# Patient Record
Sex: Female | Born: 1975 | Race: Black or African American | Hispanic: No | Smoking: Never smoker
Health system: Southern US, Community
[De-identification: ages and names within clinical notes are randomized; demographics above are authoritative.]

## PROBLEM LIST (undated history)

## (undated) DIAGNOSIS — J45909 Unspecified asthma, uncomplicated: Secondary | ICD-10-CM

## (undated) DIAGNOSIS — L709 Acne, unspecified: Secondary | ICD-10-CM

## (undated) DIAGNOSIS — T07XXXA Unspecified multiple injuries, initial encounter: Secondary | ICD-10-CM

## (undated) DIAGNOSIS — H9190 Unspecified hearing loss, unspecified ear: Secondary | ICD-10-CM

## (undated) HISTORY — DX: Unspecified hearing loss, unspecified ear: H91.90

## (undated) HISTORY — PX: HAMMER TOE SURGERY: SHX385

## (undated) HISTORY — DX: Acne, unspecified: L70.9

## (undated) HISTORY — DX: Unspecified multiple injuries, initial encounter: T07.XXXA

## (undated) HISTORY — DX: Unspecified asthma, uncomplicated: J45.909

---

## 2000-06-04 ENCOUNTER — Other Ambulatory Visit: Admission: RE | Admit: 2000-06-04 | Discharge: 2000-06-04 | Payer: Self-pay | Admitting: Internal Medicine

## 2002-09-22 ENCOUNTER — Ambulatory Visit (HOSPITAL_COMMUNITY): Admission: AD | Admit: 2002-09-22 | Discharge: 2002-09-22 | Payer: Self-pay | Admitting: Obstetrics and Gynecology

## 2002-12-03 ENCOUNTER — Observation Stay (HOSPITAL_COMMUNITY): Admission: AD | Admit: 2002-12-03 | Discharge: 2002-12-04 | Payer: Self-pay | Admitting: Obstetrics and Gynecology

## 2002-12-13 ENCOUNTER — Ambulatory Visit (HOSPITAL_COMMUNITY): Admission: AD | Admit: 2002-12-13 | Discharge: 2002-12-14 | Payer: Self-pay | Admitting: Obstetrics and Gynecology

## 2002-12-21 ENCOUNTER — Observation Stay (HOSPITAL_COMMUNITY): Admission: AD | Admit: 2002-12-21 | Discharge: 2002-12-22 | Payer: Self-pay | Admitting: Obstetrics and Gynecology

## 2002-12-22 ENCOUNTER — Inpatient Hospital Stay (HOSPITAL_COMMUNITY): Admission: AD | Admit: 2002-12-22 | Discharge: 2002-12-25 | Payer: Self-pay | Admitting: Obstetrics and Gynecology

## 2006-06-05 ENCOUNTER — Ambulatory Visit: Payer: Self-pay | Admitting: Family Medicine

## 2006-06-05 DIAGNOSIS — J309 Allergic rhinitis, unspecified: Secondary | ICD-10-CM | POA: Insufficient documentation

## 2006-06-22 ENCOUNTER — Ambulatory Visit: Payer: Self-pay | Admitting: Family Medicine

## 2006-06-22 DIAGNOSIS — R1310 Dysphagia, unspecified: Secondary | ICD-10-CM | POA: Insufficient documentation

## 2006-06-23 ENCOUNTER — Encounter (INDEPENDENT_AMBULATORY_CARE_PROVIDER_SITE_OTHER): Payer: Self-pay | Admitting: Family Medicine

## 2006-06-23 ENCOUNTER — Telehealth (INDEPENDENT_AMBULATORY_CARE_PROVIDER_SITE_OTHER): Payer: Self-pay | Admitting: *Deleted

## 2006-06-25 ENCOUNTER — Ambulatory Visit (HOSPITAL_COMMUNITY): Admission: RE | Admit: 2006-06-25 | Discharge: 2006-06-25 | Payer: Self-pay | Admitting: Family Medicine

## 2006-07-01 ENCOUNTER — Encounter (INDEPENDENT_AMBULATORY_CARE_PROVIDER_SITE_OTHER): Payer: Self-pay | Admitting: Family Medicine

## 2006-07-14 ENCOUNTER — Ambulatory Visit (HOSPITAL_COMMUNITY): Admission: RE | Admit: 2006-07-14 | Discharge: 2006-07-14 | Payer: Self-pay | Admitting: Family Medicine

## 2006-07-20 ENCOUNTER — Ambulatory Visit: Payer: Self-pay | Admitting: Family Medicine

## 2006-07-20 DIAGNOSIS — E669 Obesity, unspecified: Secondary | ICD-10-CM | POA: Insufficient documentation

## 2006-07-20 DIAGNOSIS — E041 Nontoxic single thyroid nodule: Secondary | ICD-10-CM | POA: Insufficient documentation

## 2006-07-20 LAB — CONVERTED CEMR LAB
Cholesterol, target level: 200 mg/dL
LDL Goal: 160 mg/dL

## 2006-07-21 ENCOUNTER — Encounter (INDEPENDENT_AMBULATORY_CARE_PROVIDER_SITE_OTHER): Payer: Self-pay | Admitting: Family Medicine

## 2006-07-22 LAB — CONVERTED CEMR LAB
Candida species: NEGATIVE
GC Probe Amp, Genital: NEGATIVE

## 2007-05-24 ENCOUNTER — Encounter (INDEPENDENT_AMBULATORY_CARE_PROVIDER_SITE_OTHER): Payer: Self-pay | Admitting: Family Medicine

## 2007-09-03 ENCOUNTER — Ambulatory Visit: Payer: Self-pay | Admitting: Family Medicine

## 2007-10-01 ENCOUNTER — Other Ambulatory Visit: Admission: RE | Admit: 2007-10-01 | Discharge: 2007-10-01 | Payer: Self-pay | Admitting: Family Medicine

## 2007-10-01 ENCOUNTER — Encounter (INDEPENDENT_AMBULATORY_CARE_PROVIDER_SITE_OTHER): Payer: Self-pay | Admitting: Family Medicine

## 2007-10-01 ENCOUNTER — Ambulatory Visit: Payer: Self-pay | Admitting: Family Medicine

## 2007-10-04 LAB — CONVERTED CEMR LAB
ALT: 10 units/L (ref 0–35)
AST: 15 units/L (ref 0–37)
Basophils Absolute: 0 10*3/uL (ref 0.0–0.1)
Basophils Relative: 0 % (ref 0–1)
Cholesterol: 148 mg/dL (ref 0–200)
Creatinine, Ser: 0.72 mg/dL (ref 0.40–1.20)
Eosinophils Absolute: 0.1 10*3/uL (ref 0.0–0.7)
Eosinophils Relative: 2 % (ref 0–5)
HCT: 35.6 % — ABNORMAL LOW (ref 36.0–46.0)
Hemoglobin: 12 g/dL (ref 12.0–15.0)
MCHC: 33.7 g/dL (ref 30.0–36.0)
Monocytes Absolute: 0.2 10*3/uL (ref 0.1–1.0)
RDW: 13.9 % (ref 11.5–15.5)
TSH: 0.858 microintl units/mL (ref 0.350–4.50)
Total Bilirubin: 0.3 mg/dL (ref 0.3–1.2)
Total CHOL/HDL Ratio: 2.9
VLDL: 10 mg/dL (ref 0–40)

## 2008-06-16 ENCOUNTER — Ambulatory Visit: Payer: Self-pay | Admitting: Family Medicine

## 2008-06-16 LAB — CONVERTED CEMR LAB
Protein, U semiquant: NEGATIVE
Urobilinogen, UA: 1
pH: 7

## 2008-06-21 ENCOUNTER — Encounter (INDEPENDENT_AMBULATORY_CARE_PROVIDER_SITE_OTHER): Payer: Self-pay | Admitting: Family Medicine

## 2008-06-22 ENCOUNTER — Encounter (INDEPENDENT_AMBULATORY_CARE_PROVIDER_SITE_OTHER): Payer: Self-pay | Admitting: Family Medicine

## 2008-06-22 LAB — CONVERTED CEMR LAB: Hep B S Ab: POSITIVE — AB

## 2008-07-06 ENCOUNTER — Encounter (INDEPENDENT_AMBULATORY_CARE_PROVIDER_SITE_OTHER): Payer: Self-pay | Admitting: Family Medicine

## 2010-02-25 ENCOUNTER — Encounter: Payer: Self-pay | Admitting: Family Medicine

## 2010-06-21 NOTE — H&P (Signed)
NAME:  Sherri, Fleming                ACCOUNT NO.:  [id number]   MEDICAL RECORD NO.:  [id number]                   PATIENT TYPE:  INP   LOCATION:  LDR1                                 FACILITY:  [facility]   PHYSICIAN:  Tilda Burrow, M.D.              DATE OF BIRTH:  [birth year]   DATE OF ADMISSION:  12/22/2002  DATE OF DISCHARGE:                                HISTORY & PHYSICAL   CHIEF COMPLAINT:  Spontaneous rupture of membranes at 0945 without labor.   HISTORY OF PRESENT ILLNESS:  Sherri Fleming is a 35 year old gravida 1, para 0 with  an EDC of January 02, 2003, based on first and second trimester ultrasound  with a correlating last menstrual period placing her at 69 weeks'  gestational age.  She began prenatal care early in the first trimester and  has had regular visits.  Prenatal labs includes blood type A-positive,  rubella immune.  HBsAg, HIV, syphilis, gonorrhea and Chlamydia are all  negative.  One-hour GTT was normal at 109.  Sickle cell trait is negative.  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  Blood pressures have been anywhere from 110s to 150s  over 60s to 70s, with most of the blood pressures being in the 120-to-130  region.  Total weight gain has been 30 pounds with appropriate fundal height  growth.  The pregnancy course has basically been uneventful.   PAST MEDICAL HISTORY:  Past medical history positive for seasonal allergies.   PAST SURGICAL HISTORY:  Wisdom teeth removal.  No history of blood  transfusions or anesthesia complications.   ALLERGIES:  No known drug allergies.   SOCIAL HISTORY:  Married.  Works at [employer].   FAMILY HISTORY:  Family history noncontributory.   PHYSICAL EXAM:  HEENT:  Within normal limits.  HEART:  Regular rate and rhythm.  LUNGS:  Lungs are clear.  ABDOMEN:  Abdomen is soft and nontender.  She is having mild infrequent  uterine contractions at this point.  PELVIC:  Leaking clear amniotic fluid.  Cervix is 1 to 2, 50%,  minus 1,  which is not a change from an exam that she had 0830, five hours ago.  EXTREMITIES:  Legs are negative.   ACCESSORY CLINICAL DATA:  Fetal heart tracing is reactive without  decelerations.   IMPRESSION:  1. Intrauterine pregnancy at 38 weeks, premature rupture of membranes.  2. Group B streptococcus carrier.   PLAN:  GBS prophylaxis.  We will initiate Pitocin augmentation at this time.  The patient plans an epidural.     _____________________________________  ___________________________________________  Jacklyn Shell, C.N.M.        Tilda Burrow, M.D.   FC/MEDQ  D:  12/22/2002  T:  12/22/2002  Job:  [id number]   cc:   [facility]

## 2010-06-21 NOTE — Op Note (Signed)
NAME:  Sherri Fleming, Sherri Fleming                ACCOUNT NO.:  [id number]   MEDICAL RECORD NO.:  [id number]                   PATIENT TYPE:  INP   LOCATION:  A428                                 FACILITY:  [facility]   PHYSICIAN:  Tilda Burrow, M.D.              DATE OF BIRTH:  [birth year]   DATE OF PROCEDURE:  DATE OF DISCHARGE:                                 OPERATIVE REPORT   LABOR COURSE:  Sherri Fleming was noted to be fully dilated at approximately 2040.  She then developed an urge to push approximately an hour later.  After a 25-  minute second stage, she delivered a viable female infant at 2213.  The mouth  and nose were suctioned on the perineum and the body delivered without  difficulty.  Weight is 6 pounds 13 ounces, Apgars 9 and 9.  20 units of  Pitocin diluted in 1000 cc of lactated Ringer's was then infused rapidly IV.  The placenta separated spontaneously and was delivered via controlled cord  traction at 2217.  It was inspected and appeared to be intact with a three-  vessel cord.  The fundus was immediately firm, and no blood loss was noted.  The vagina was then inspected, and a shallow second-degree laceration was  noted.  It was infiltrated with 10 cc of 1% Xylocaine and repaired using a 2-  0 Vicryl.  The epidural catheter was then removed and the blue tip  visualized as being intact.  Estimated blood loss was less than 100 cc.     ________________________________________  ___________________________________________  Jacklyn Shell, C.N.M.           Tilda Burrow, M.D.   FC/MEDQ  D:  12/22/2002  T:  12/23/2002  Job:  [id number]

## 2010-06-21 NOTE — H&P (Signed)
NAME:  Sherri Fleming, Sherri Fleming                ACCOUNT NO.:  [id number]   MEDICAL RECORD NO.:  [id number]                   PATIENT TYPE:  INP   LOCATION:  LDR1                                 FACILITY:  [facility]   PHYSICIAN:  Tilda Burrow, M.D.              DATE OF BIRTH:  March 06, 1975   DATE OF ADMISSION:  12/03/2002  DATE OF DISCHARGE:                                HISTORY & PHYSICAL   ADMISSION DIAGNOSES:  1. Pregnancy at 35 weeks 5 days.  2. Preterm labor.   HISTORY OF PRESENT ILLNESS:  This is a 35 year old female, gravida 1, para  0, AB 0, LMP March 27, 2002, placing menstrual [facility] November 29 with  ultrasound suggested [facility] of November 22 at 7 weeks  and December 23, 2002,  at [redacted] weeks gestation.  She is admitted at this time after presenting with  light spotting noted per vagina.  External monitoring shows a uterine  irritability pattern with contractions of a mild nature every minute to  minute and one-half with relaxation of the uterus between.  The patient's  abdomen is essentially nontender.  She has had some slight periumbilical  tenderness since Tuesday.  Since her last office visit on Monday, she has  changed her cervix from closed, 50%, -2, and posterior to 1 cm, 75%, -1  station, equina anterior (same examiner).  The patient is admitted for  prodromal labor.  Even the gestational age is preterm by menstrual criteria,  the fetus is quite sizeable and estimated fetal weight clinical weight is  greater than 6 pounds.  Given the slightly atypical irritability pattern, we  will not attempt tocolysis but allow labor to progress.  Fetal well being  appears stable at present with good fetal activity and fetal heart rate  accelerations audible.  She has a negative CST.   PAST MEDICAL HISTORY:  Benign.   PAST SURGICAL HISTORY:  Wisdom tooth extraction.   ALLERGIES:  None.   HABITS:  Negative for cigarettes, alcohol, recreational drugs.   SOCIAL HISTORY:  Works for  our [employer].   PHYSICAL EXAMINATION:  GENERAL:  Healthy, large-frame, African-American  female who is quite comfortable.  VITAL SIGNS:  Height 5 feet 8 inches.  Estimated weight 200.  Blood pressure  145/80, temperature 98.8, pulse 83, respirations 18.   PLAN:  1. Admission.  2.     Allow to ambulate  3. Intermittent fetal monitoring.  4. Await patient's development of a more effective labor pattern.     ___________________________________________                                         Tilda Burrow, M.D.   JVF/MEDQ  D:  12/03/2002  T:  12/03/2002  Job:  [id number]   cc:   Francoise Schaumann. Halm, D.O.  [address]  [place]  [place] [address]  Fax: [phone]

## 2012-05-13 ENCOUNTER — Telehealth: Payer: Self-pay | Admitting: Family Medicine

## 2012-05-13 ENCOUNTER — Telehealth: Payer: Self-pay | Admitting: Nurse Practitioner

## 2012-05-13 MED ORDER — CETIRIZINE HCL 10 MG PO TABS: 10.0000 mg | ORAL_TABLET | Freq: Every day | ORAL | Status: DC

## 2012-05-13 NOTE — Telephone Encounter (Signed)
Tried to send in RX for Zyrtec but need name of pharmacy.

## 2012-05-13 NOTE — Telephone Encounter (Signed)
Pt wants prescription called in Zyrtec 10mg , taking two a day. She knows its over the counter but she needs prescription so she can pay with her flex spending

## 2012-05-13 NOTE — Telephone Encounter (Signed)
Patient requesting Rx for insurance reasons

## 2012-05-13 NOTE — Telephone Encounter (Signed)
[facility] in [place]- Rx electronically sent by [employer].

## 2012-05-13 NOTE — Telephone Encounter (Signed)
Patient notified

## 2012-05-20 ENCOUNTER — Encounter: Payer: Self-pay | Admitting: *Deleted

## 2012-05-24 ENCOUNTER — Encounter: Payer: Self-pay | Admitting: Nurse Practitioner

## 2012-05-24 ENCOUNTER — Ambulatory Visit (INDEPENDENT_AMBULATORY_CARE_PROVIDER_SITE_OTHER): Payer: BC Managed Care – PPO | Admitting: Nurse Practitioner

## 2012-05-24 VITALS — BP 110/72 | HR 80 | Ht 64.0 in | Wt 192.0 lb

## 2012-05-24 DIAGNOSIS — Z01419 Encounter for gynecological examination (general) (routine) without abnormal findings: Secondary | ICD-10-CM

## 2012-05-24 DIAGNOSIS — Z0000 Encounter for general adult medical examination without abnormal findings: Secondary | ICD-10-CM

## 2012-05-24 DIAGNOSIS — E669 Obesity, unspecified: Secondary | ICD-10-CM

## 2012-05-24 MED ORDER — ETONOGESTREL-ETHINYL ESTRADIOL 0.12-0.015 MG/24HR VA RING: VAGINAL_RING | VAGINAL | Status: DC

## 2012-05-24 MED ORDER — PHENTERMINE HCL 37.5 MG PO CAPS: 37.5000 mg | ORAL_CAPSULE | ORAL | Status: DC

## 2012-05-24 NOTE — Progress Notes (Signed)
  Subjective:    Patient ID: Sherri Fleming, female    DOB: 1975-08-04, 37 y.o.   MRN: [id number]  HPI  presents for wellness physical. Having regular menstrual cycles, normal flow. No breakthrough bleeding. Currently on NuvaRing. Same sexual partner. No vaginal discharge or pelvic pain. Has lost 35 pounds but has hit a plateau. Trying to exercise on a regular basis. Getting regular eye exams and dental exams. Had routine labs done this past fall at work, no copy available during office visit.    Review of Systems  Constitutional: Negative for activity change, appetite change and fatigue.  Respiratory: Negative for chest tightness and shortness of breath.   Cardiovascular: Negative for chest pain.  Gastrointestinal: Negative for abdominal distention.  Genitourinary: Negative for dysuria, urgency, frequency, vaginal discharge, difficulty urinating, menstrual problem and pelvic pain.       Objective:   Physical Exam  Constitutional: She is oriented to person, place, and time. She appears well-developed. No distress.  HENT:  Right Ear: External ear normal.  Left Ear: External ear normal.  Mouth/Throat: Oropharynx is clear and moist.  Neck: Normal range of motion. Neck supple. No tracheal deviation present. No thyromegaly present.  Cardiovascular: Normal rate, regular rhythm and normal heart sounds.  Exam reveals no gallop.   No murmur heard. Pulmonary/Chest: Effort normal and breath sounds normal.  Abdominal: Soft. She exhibits no distension. There is no tenderness.  Genitourinary: Vagina normal and uterus normal. No vaginal discharge found.  Musculoskeletal: She exhibits no edema.  Lymphadenopathy:    She has no cervical adenopathy.  Neurological: She is alert and oriented to person, place, and time.  Skin: Skin is warm and dry. No rash noted.  Psychiatric: She has a normal mood and affect. Her behavior is normal.  Breast exam dense tissue with some fine nodularity, no  dominant masses. Axilla no adenopathy. External GU normal limit. No CMT.        Assessment & Plan:  Well woman exam  Morbid obesity  Meds ordered this encounter  Medications  . polyethylene glycol powder (GLYCOLAX/MIRALAX) powder    Sig:   . phentermine 37.5 MG capsule    Sig: Take 1 capsule (37.5 mg total) by mouth every morning.    Dispense:  30 capsule    Refill:  2    Order Specific Question:  Supervising Provider    Answer:  Merlyn Albert [[id number]]  . etonogestrel-ethinyl estradiol (NUVARING) 0.12-0.015 MG/24HR vaginal ring    Sig: Insert vaginally and leave in place for 3 consecutive weeks, then remove for 1 week.    Dispense:  1 each    Refill:  12    Order Specific Question:  Supervising Provider    Answer:  Merlyn Albert [[id number]]   Discussed potential adverse effects of phentermine. DC med and call if any problems. Otherwise recheck in one month. Recommend healthy diet and regular exercise.

## 2012-05-24 NOTE — Assessment & Plan Note (Signed)
Started on phentermine. Recheck 1 month.

## 2012-08-10 ENCOUNTER — Ambulatory Visit (INDEPENDENT_AMBULATORY_CARE_PROVIDER_SITE_OTHER): Payer: BC Managed Care – PPO | Admitting: Family Medicine

## 2012-08-10 ENCOUNTER — Encounter: Payer: Self-pay | Admitting: Family Medicine

## 2012-08-10 VITALS — BP 110/80 | Temp 98.5°F | Wt 196.1 lb

## 2012-08-10 DIAGNOSIS — L301 Dyshidrosis [pompholyx]: Secondary | ICD-10-CM

## 2012-08-10 MED ORDER — TRIAMCINOLONE ACETONIDE 0.1 % EX CREA: TOPICAL_CREAM | Freq: Two times a day (BID) | CUTANEOUS | Status: DC

## 2012-08-10 NOTE — Progress Notes (Signed)
  Subjective:    Patient ID: Sherri Fleming, female    DOB: March 30, 1975, 37 y.o.   MRN: [id number]  Rash This is a new problem. The current episode started in the past 7 days. The problem has been gradually worsening since onset. The affected locations include the right hand. The rash is characterized by blistering and itchiness. She was exposed to nothing. Pertinent negatives include no anorexia, congestion or cough. Past treatments include topical steroids. The treatment provided mild relief.      Review of Systems  HENT: Negative for congestion.   Respiratory: Negative for cough.   Gastrointestinal: Negative for anorexia.  Skin: Positive for rash.       Objective:   Physical Exam  Acute distress. Lungs clear. Heart regular in rhythm. HEENT normal. Hands patch of blisters pruritic in nature.      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 Symptomatic care discussed. WSL

## 2012-08-10 NOTE — Patient Instructions (Signed)
dyshydrotic eczema

## 2012-11-03 ENCOUNTER — Other Ambulatory Visit: Payer: Self-pay | Admitting: Nurse Practitioner

## 2012-11-03 ENCOUNTER — Telehealth: Payer: Self-pay | Admitting: Nurse Practitioner

## 2012-11-03 ENCOUNTER — Other Ambulatory Visit: Payer: Self-pay | Admitting: Family Medicine

## 2012-11-03 MED ORDER — ALBUTEROL SULFATE HFA 108 (90 BASE) MCG/ACT IN AERS: 2.0000 | INHALATION_SPRAY | RESPIRATORY_TRACT | Status: DC | PRN

## 2012-11-03 NOTE — Telephone Encounter (Signed)
Patient notified

## 2012-11-03 NOTE — Telephone Encounter (Signed)
Pt states she had an Albuterol Inhaler in the past and wants to know if we can call her in a script for a new one to [facility], this was prescribed through her previous doc see chart?

## 2012-11-03 NOTE — Telephone Encounter (Signed)
Yes. Will refill based on history. Call back if needed for wheezing more than 3 times per week.

## 2012-12-09 ENCOUNTER — Other Ambulatory Visit: Payer: Self-pay

## 2012-12-16 ENCOUNTER — Other Ambulatory Visit: Payer: Self-pay | Admitting: Nurse Practitioner

## 2012-12-16 ENCOUNTER — Other Ambulatory Visit: Payer: Self-pay | Admitting: Family Medicine

## 2012-12-27 ENCOUNTER — Telehealth: Payer: Self-pay | Admitting: Family Medicine

## 2012-12-27 NOTE — Telephone Encounter (Signed)
Please refill phentermine 37.5 MG capsule   The following meds need to be resent to [facility] [place] They tell the patient they never got them on 12/17/12 at the  Same time the doxycycline was issued.   adapalene (DIFFERIN) 0.1 % gel clindamycin (CLINDAGEL) 1 % gel

## 2012-12-27 NOTE — Telephone Encounter (Signed)
Last seen 05/24/12. Our policy requires routine follow up for weight loss meds usually every 3 months.Needs office visit before refills.

## 2012-12-27 NOTE — Telephone Encounter (Signed)
Telephone call no answer 

## 2012-12-27 NOTE — Telephone Encounter (Signed)
Last seen in July 14

## 2013-01-12 ENCOUNTER — Telehealth: Payer: Self-pay | Admitting: Family Medicine

## 2013-01-12 NOTE — Telephone Encounter (Signed)
Prior auth obtained for ADAPALENE 0.1% EX GEL, expires 01/03/2015, faxed approval to [facility]

## 2013-03-10 ENCOUNTER — Telehealth: Payer: Self-pay | Admitting: Family Medicine

## 2013-03-10 NOTE — Telephone Encounter (Signed)
Patient needs bloodwork ordered. She has an appointment next Friday to talk about low iron that she discovered from blood drive.

## 2013-03-11 NOTE — Telephone Encounter (Signed)
Please clarify. Has someone done a panel for iron levels or B12? If not, let me know so I can place orders. Thanks.

## 2013-03-14 ENCOUNTER — Other Ambulatory Visit: Payer: Self-pay | Admitting: Nurse Practitioner

## 2013-03-14 DIAGNOSIS — D649 Anemia, unspecified: Secondary | ICD-10-CM

## 2013-03-14 NOTE — Telephone Encounter (Signed)
Will order labs for low iron/anemia

## 2013-03-15 NOTE — Telephone Encounter (Signed)
Patient notified blood work has been ordered.

## 2013-03-18 ENCOUNTER — Ambulatory Visit: Payer: BC Managed Care – PPO | Admitting: Nurse Practitioner

## 2013-06-21 ENCOUNTER — Telehealth: Payer: Self-pay | Admitting: Family Medicine

## 2013-06-21 NOTE — Telephone Encounter (Signed)
Patient needs order for blood work. °

## 2013-06-23 NOTE — Telephone Encounter (Signed)
Notified pt on voicemail she can go to [facility] to have bloodwork done. Orders are in from February.

## 2013-06-23 NOTE — Telephone Encounter (Signed)
If this is for feb b w pot did not do, plz re order

## 2013-06-30 ENCOUNTER — Telehealth: Payer: Self-pay | Admitting: Family Medicine

## 2013-06-30 DIAGNOSIS — Z79899 Other long term (current) drug therapy: Secondary | ICD-10-CM

## 2013-06-30 DIAGNOSIS — Z13220 Encounter for screening for lipoid disorders: Secondary | ICD-10-CM

## 2013-06-30 NOTE — Telephone Encounter (Signed)
Patient wants to know if we can order her lab work for her upcoming physical next week. She is off tomorrow, so she is hoping these orders can be put in today hopefully. She is seeing [name] next week, but can Dr. Brett Canales order the labs?  She also wants to make sure we include to have her A1C and insulin checked because diabetes runs in her family.

## 2013-06-30 NOTE — Telephone Encounter (Signed)
Patient notified

## 2013-06-30 NOTE — Telephone Encounter (Signed)
Lip liv m7 

## 2013-06-30 NOTE — Telephone Encounter (Signed)
No recent lab work

## 2013-07-01 LAB — LIPID PANEL
CHOL/HDL RATIO: 3 ratio
CHOLESTEROL: 151 mg/dL (ref 0–200)
HDL: 50 mg/dL (ref >39)
LDL Cholesterol: 94 mg/dL (ref 0–99)
TRIGLYCERIDES: 35 mg/dL (ref <150)
VLDL: 7 mg/dL (ref 0–40)

## 2013-07-01 LAB — FERRITIN: Ferritin: 40 ng/mL (ref 10–291)

## 2013-07-01 LAB — CBC WITH DIFFERENTIAL/PLATELET
BASOS ABS: 0 10*3/uL (ref 0.0–0.1)
BASOS PCT: 0 % (ref 0–1)
EOS ABS: 0.1 10*3/uL (ref 0.0–0.7)
EOS PCT: 2 % (ref 0–5)
HEMATOCRIT: 35 % — AB (ref 36.0–46.0)
Hemoglobin: 11.8 g/dL — ABNORMAL LOW (ref 12.0–15.0)
Lymphocytes Relative: 37 % (ref 12–46)
Lymphs Abs: 1.3 10*3/uL (ref 0.7–4.0)
MCH: 30.5 pg (ref 26.0–34.0)
MCHC: 33.7 g/dL (ref 30.0–36.0)
MCV: 90.4 fL (ref 78.0–100.0)
MONO ABS: 0.3 10*3/uL (ref 0.1–1.0)
Monocytes Relative: 7 % (ref 3–12)
Neutro Abs: 1.9 10*3/uL (ref 1.7–7.7)
Neutrophils Relative %: 54 % (ref 43–77)
PLATELETS: 422 10*3/uL — AB (ref 150–400)
RBC: 3.87 MIL/uL (ref 3.87–5.11)
RDW: 14.6 % (ref 11.5–15.5)
WBC: 3.6 10*3/uL — ABNORMAL LOW (ref 4.0–10.5)

## 2013-07-01 LAB — BASIC METABOLIC PANEL
BUN: 13 mg/dL (ref 6–23)
CALCIUM: 8.4 mg/dL (ref 8.4–10.5)
CO2: 26 mEq/L (ref 19–32)
Chloride: 105 mEq/L (ref 96–112)
Creat: 0.86 mg/dL (ref 0.50–1.10)
GLUCOSE: 96 mg/dL (ref 70–99)
Potassium: 4 mEq/L (ref 3.5–5.3)
SODIUM: 135 meq/L (ref 135–145)

## 2013-07-01 LAB — HEPATIC FUNCTION PANEL
ALBUMIN: 3.3 g/dL — AB (ref 3.5–5.2)
ALT: 8 U/L (ref 0–35)
AST: 13 U/L (ref 0–37)
Alkaline Phosphatase: 42 U/L (ref 39–117)
BILIRUBIN DIRECT: 0.1 mg/dL (ref 0.0–0.3)
Indirect Bilirubin: 0.3 mg/dL (ref 0.2–1.2)
TOTAL PROTEIN: 6.3 g/dL (ref 6.0–8.3)
Total Bilirubin: 0.4 mg/dL (ref 0.2–1.2)

## 2013-07-01 LAB — IRON AND TIBC
%SAT: 15 % — AB (ref 20–55)
Iron: 44 ug/dL (ref 42–145)
TIBC: 288 ug/dL (ref 250–470)
UIBC: 244 ug/dL (ref 125–400)

## 2013-07-01 LAB — VITAMIN B12: Vitamin B-12: 655 pg/mL (ref 211–911)

## 2013-07-06 ENCOUNTER — Encounter: Payer: Self-pay | Admitting: Nurse Practitioner

## 2013-07-06 ENCOUNTER — Ambulatory Visit (INDEPENDENT_AMBULATORY_CARE_PROVIDER_SITE_OTHER): Payer: BC Managed Care – PPO | Admitting: Nurse Practitioner

## 2013-07-06 VITALS — BP 128/80 | Ht 64.5 in | Wt 204.0 lb

## 2013-07-06 DIAGNOSIS — Z01419 Encounter for gynecological examination (general) (routine) without abnormal findings: Secondary | ICD-10-CM

## 2013-07-06 DIAGNOSIS — Z0000 Encounter for general adult medical examination without abnormal findings: Secondary | ICD-10-CM

## 2013-07-06 DIAGNOSIS — L708 Other acne: Secondary | ICD-10-CM

## 2013-07-06 DIAGNOSIS — L709 Acne, unspecified: Secondary | ICD-10-CM

## 2013-07-06 DIAGNOSIS — J45909 Unspecified asthma, uncomplicated: Secondary | ICD-10-CM

## 2013-07-06 MED ORDER — FLUTICASONE PROPIONATE 50 MCG/ACT NA SUSP: NASAL | Status: DC

## 2013-07-06 MED ORDER — ALBUTEROL SULFATE HFA 108 (90 BASE) MCG/ACT IN AERS: 2.0000 | INHALATION_SPRAY | RESPIRATORY_TRACT | Status: DC | PRN

## 2013-07-06 MED ORDER — CLINDAMYCIN PHOSPHATE 1 % EX GEL: CUTANEOUS | Status: DC

## 2013-07-06 MED ORDER — MONTELUKAST SODIUM 10 MG PO TABS: ORAL_TABLET | ORAL | Status: DC

## 2013-07-06 MED ORDER — DOXYCYCLINE HYCLATE 100 MG PO TABS: ORAL_TABLET | ORAL | Status: DC

## 2013-07-06 MED ORDER — CETIRIZINE HCL 10 MG PO TABS: 10.0000 mg | ORAL_TABLET | Freq: Every day | ORAL | Status: DC

## 2013-07-06 MED ORDER — ADAPALENE 0.1 % EX GEL: CUTANEOUS | Status: DC

## 2013-07-06 MED ORDER — PHENTERMINE HCL 37.5 MG PO CAPS: 37.5000 mg | ORAL_CAPSULE | ORAL | Status: DC

## 2013-07-07 ENCOUNTER — Encounter: Payer: Self-pay | Admitting: Nurse Practitioner

## 2013-07-07 DIAGNOSIS — J45909 Unspecified asthma, uncomplicated: Secondary | ICD-10-CM | POA: Insufficient documentation

## 2013-07-07 DIAGNOSIS — L709 Acne, unspecified: Secondary | ICD-10-CM | POA: Insufficient documentation

## 2013-07-07 NOTE — Progress Notes (Signed)
Subjective:    Patient ID: Sherri Fleming, female    DOB: 05-05-1975, 38 y.o.   MRN: [id number]  HPI presents for her wellness exam. Regular vision and dental exams. No sexual activity. No new partners. Regular cycles, normal flow. Regular walking program. Has taken Phentermine fine in the past.    Review of Systems  Constitutional: Negative for activity change, appetite change and fatigue.  HENT: Positive for congestion and rhinorrhea. Negative for dental problem, ear pain, sinus pressure and sore throat.   Respiratory: Negative for cough, chest tightness, shortness of breath and wheezing.   Cardiovascular: Negative for chest pain.  Gastrointestinal: Negative for nausea, abdominal pain, diarrhea, constipation and abdominal distention.  Genitourinary: Negative for dysuria, urgency, frequency, vaginal discharge, enuresis, difficulty urinating, genital sores, menstrual problem and pelvic pain.       Objective:   Physical Exam  Vitals reviewed. Constitutional: She is oriented to person, place, and time. She appears well-developed. No distress.  HENT:  Right Ear: External ear normal.  Left Ear: External ear normal.  Mouth/Throat: Oropharynx is clear and moist.  Neck: Normal range of motion. Neck supple. No tracheal deviation present. No thyromegaly present.  Cardiovascular: Normal rate, regular rhythm and normal heart sounds.  Exam reveals no gallop.   No murmur heard. Pulmonary/Chest: Effort normal and breath sounds normal.  Abdominal: Soft. She exhibits no distension. There is no tenderness.  Genitourinary: Vagina normal and uterus normal. No vaginal discharge found.  External GU: no lesions or rashes. Vagina: no discharge. No CMT. Bimanual exam: nontender, no masses.   Musculoskeletal: She exhibits no edema.  Lymphadenopathy:    She has no cervical adenopathy.  Neurological: She is alert and oriented to person, place, and time.  Skin: Skin is warm and dry. No rash  noted.  Psychiatric: She has a normal mood and affect. Her behavior is normal.  Breast exam: no masses; axillae no adenopathy.        Assessment & Plan:  Well woman exam  Morbid obesity  Meds ordered this encounter  Medications  . phentermine 37.5 MG capsule    Sig: Take 1 capsule (37.5 mg total) by mouth every morning.    Dispense:  30 capsule    Refill:  2    Order Specific Question:  Supervising Provider    Answer:  Merlyn Albert [[id number]]  . montelukast (SINGULAIR) 10 MG tablet    Sig: TAKE ONE TABLET BY MOUTH EVERY DAY IN THE EVENING AS NEEDED FOR ALLERGIES/ASTHMA.    Dispense:  30 tablet    Refill:  11    Order Specific Question:  Supervising Provider    Answer:  Merlyn Albert [[id number]]  . fluticasone (FLONASE) 50 MCG/ACT nasal spray    Sig: USE TWO SPRAY IN EACH NOSTRIL EVERY DAY    Dispense:  16 g    Refill:  11    Order Specific Question:  Supervising Provider    Answer:  Merlyn Albert [[id number]]  . cetirizine (ZYRTEC) 10 MG tablet    Sig: Take 1 tablet (10 mg total) by mouth daily.    Dispense:  30 tablet    Refill:  11    Order Specific Question:  Supervising Provider    Answer:  Merlyn Albert [[id number]]  . albuterol (PROVENTIL HFA;VENTOLIN HFA) 108 (90 BASE) MCG/ACT inhaler    Sig: Inhale 2 puffs into the lungs every 4 (four) hours as needed for wheezing.    Dispense:  1 Inhaler  Refill:  2    Order Specific Question:  Supervising Provider    Answer:  Merlyn AlbertLUKING, WILLIAM S [[id number]]  . clindamycin (CLINDAGEL) 1 % gel    Sig: APPLY TWICE DAILY TO AFFECTED AREA    Dispense:  75 g    Refill:  4    Hold until patient requests refill    Order Specific Question:  Supervising Provider    Answer:  Merlyn AlbertLUKING, WILLIAM S [[id number]]  . adapalene (DIFFERIN) 0.1 % gel    Sig: APPLY TO ACNE AT BEDTIME AS NEEDED    Dispense:  45 g    Refill:  2    Hold until patient wants refill    Order Specific Question:  Supervising Provider    Answer:  Merlyn AlbertLUKING, WILLIAM S [[id number]]  .  doxycycline (VIBRA-TABS) 100 MG tablet    Sig: TAKE ONE TABLET BY MOUTH TWICE DAILY PRN ACNE    Dispense:  60 tablet    Refill:  0    Hold until patient requests refill    Order Specific Question:  Supervising Provider    Answer:  Merlyn AlbertLUKING, WILLIAM S [[id number]]   Continue exercise. Recommend healthy diet, daily vitamin D and calcium.  Return in about 3 months (around 10/06/2013).

## 2013-10-07 ENCOUNTER — Encounter: Payer: Self-pay | Admitting: Nurse Practitioner

## 2013-10-07 ENCOUNTER — Ambulatory Visit (INDEPENDENT_AMBULATORY_CARE_PROVIDER_SITE_OTHER): Payer: BC Managed Care – PPO | Admitting: Nurse Practitioner

## 2013-10-07 VITALS — BP 130/88 | Ht 64.5 in | Wt 207.0 lb

## 2013-10-07 DIAGNOSIS — L700 Acne vulgaris: Secondary | ICD-10-CM

## 2013-10-07 DIAGNOSIS — L708 Other acne: Secondary | ICD-10-CM

## 2013-10-07 MED ORDER — CLINDAMYCIN PHOSPHATE 1 % EX GEL: CUTANEOUS | Status: DC

## 2013-10-07 MED ORDER — LORCASERIN HCL 10 MG PO TABS: ORAL_TABLET | ORAL | Status: DC

## 2013-10-07 MED ORDER — ADAPALENE 0.1 % EX GEL: CUTANEOUS | Status: DC

## 2013-10-07 MED ORDER — MONTELUKAST SODIUM 10 MG PO TABS: ORAL_TABLET | ORAL | Status: DC

## 2013-10-07 NOTE — Patient Instructions (Signed)
21 day fix 

## 2013-10-10 ENCOUNTER — Encounter: Payer: Self-pay | Admitting: Nurse Practitioner

## 2013-10-10 NOTE — Progress Notes (Signed)
Subjective:  Presents for routine followup. Requesting refill on her medications including once for acne. Would like to try a different weight loss medicine. Working out daily. Doing well with her diet. Interested in trying Belviq. Has stopped phentermine.  Objective:   BP 130/88  Ht 5' 4.5" (1.638 m)  Wt 207 lb (93.895 kg)  BMI 35.00 kg/m2  LMP 10/07/2013 NAD. Alert, oriented. Lungs clear. Heart regular rate rhythm.  Assessment: Problem List Items Addressed This Visit     Musculoskeletal and Integument   Acne - Primary   Relevant Medications      adapalene (DIFFERIN) 0.1 % gel      clindamycin (CLINDAGEL) 1 % gel     Other   Morbid obesity   Relevant Medications      Lorcaserin HCl (BELVIQ) 10 MG TABS      Lorcaserin HCl (BELVIQ) 10 MG TABS      Plan:  Meds ordered this encounter  Medications  . adapalene (DIFFERIN) 0.1 % gel    Sig: APPLY TO ACNE AT BEDTIME AS NEEDED    Dispense:  45 g    Refill:  2    Hold until patient wants refill    Order Specific Question:  Supervising Provider    Answer:  Merlyn Albert [[id number]]  . clindamycin (CLINDAGEL) 1 % gel    Sig: APPLY TWICE DAILY TO AFFECTED AREA    Dispense:  75 g    Refill:  4    Hold until patient requests refill    Order Specific Question:  Supervising Provider    Answer:  Merlyn Albert [[id number]]  . montelukast (SINGULAIR) 10 MG tablet    Sig: TAKE ONE TABLET BY MOUTH EVERY DAY IN THE EVENING AS NEEDED FOR ALLERGIES/ASTHMA.    Dispense:  30 tablet    Refill:  11    Order Specific Question:  Supervising Provider    Answer:  Merlyn Albert [[id number]]  . Lorcaserin HCl (BELVIQ) 10 MG TABS    Sig: One po BID for weight loss    Dispense:  30 tablet    Refill:  0    Order Specific Question:  Supervising Provider    Answer:  Merlyn Albert [[id number]]  . Lorcaserin HCl (BELVIQ) 10 MG TABS    Sig: One po BID for weight loss    Dispense:  60 tablet    Refill:  1    Order Specific Question:  Supervising Provider     Answer:  Merlyn Albert [[id number]]   Encouraged continued activity and healthy diet. DC Belviq and call if any adverse effects. Return in about 3 months (around 01/06/2014).

## 2013-11-01 ENCOUNTER — Telehealth: Payer: Self-pay | Admitting: Family Medicine

## 2013-11-01 NOTE — Telephone Encounter (Signed)
Patient was seen by Sherri Fleming on 9/4 but now her eczema has flared up on the back of her neck and around her forehead edge line. She wants to know if you need to up her dosage on allergy medication and can you [facility] [place]

## 2013-11-03 MED ORDER — TRIAMCINOLONE ACETONIDE 0.1 % EX CREA: 1.0000 "application " | TOPICAL_CREAM | Freq: Two times a day (BID) | CUTANEOUS | Status: DC

## 2013-11-03 NOTE — Addendum Note (Signed)
Addended by: Metro KungICHARDS, WENDY M on: 11/03/2013 02:45 PM   Modules accepted: Orders

## 2013-11-03 NOTE — Telephone Encounter (Signed)
Please send in Rx for triamcinolone 0.1% cream bid to eczema prn up to 2 weeks at a time (30 GM 0 RF). Office visit if persists.

## 2013-11-03 NOTE — Telephone Encounter (Signed)
Med sent to pharm. Pt notified on voicemail.  

## 2014-04-21 ENCOUNTER — Ambulatory Visit (INDEPENDENT_AMBULATORY_CARE_PROVIDER_SITE_OTHER): Payer: BLUE CROSS/BLUE SHIELD | Admitting: Nurse Practitioner

## 2014-04-21 ENCOUNTER — Encounter: Payer: Self-pay | Admitting: Nurse Practitioner

## 2014-04-21 VITALS — BP 122/82 | Temp 98.5°F | Ht 65.5 in | Wt 207.0 lb

## 2014-04-21 DIAGNOSIS — M545 Low back pain, unspecified: Secondary | ICD-10-CM

## 2014-04-21 DIAGNOSIS — R1011 Right upper quadrant pain: Secondary | ICD-10-CM

## 2014-04-21 DIAGNOSIS — I10 Essential (primary) hypertension: Secondary | ICD-10-CM

## 2014-04-21 DIAGNOSIS — K219 Gastro-esophageal reflux disease without esophagitis: Secondary | ICD-10-CM

## 2014-04-21 DIAGNOSIS — Z79899 Other long term (current) drug therapy: Secondary | ICD-10-CM

## 2014-04-21 LAB — POCT URINALYSIS DIPSTICK
Ketones, UA: POSITIVE
Spec Grav, UA: 1.015
pH, UA: 7

## 2014-04-21 NOTE — Patient Instructions (Addendum)
2 cups of Activia yogurt OR [employer] Hot Smart Relief TENS unit

## 2014-04-22 ENCOUNTER — Encounter: Payer: Self-pay | Admitting: Nurse Practitioner

## 2014-04-22 LAB — BASIC METABOLIC PANEL
BUN/Creatinine Ratio: 9 (ref 9–23)
BUN: 8 mg/dL (ref 6–24)
CALCIUM: 9.2 mg/dL (ref 8.7–10.2)
CO2: 24 mmol/L (ref 18–29)
Chloride: 97 mmol/L (ref 97–108)
Creatinine, Ser: 0.94 mg/dL (ref 0.57–1.00)
GFR calc non Af Amer: 76 mL/min/{1.73_m2} (ref >59)
GFR, EST AFRICAN AMERICAN: 88 mL/min/{1.73_m2} (ref >59)
GLUCOSE: 87 mg/dL (ref 65–99)
Potassium: 3.9 mmol/L (ref 3.5–5.2)
SODIUM: 139 mmol/L (ref 134–144)

## 2014-04-22 LAB — CBC WITH DIFFERENTIAL/PLATELET
BASOS: 0 %
Basophils Absolute: 0 10*3/uL (ref 0.0–0.2)
Eos: 1 %
Eosinophils Absolute: 0.1 10*3/uL (ref 0.0–0.4)
HCT: 35.1 % (ref 34.0–46.6)
HEMOGLOBIN: 12 g/dL (ref 11.1–15.9)
Immature Grans (Abs): 0 10*3/uL (ref 0.0–0.1)
Immature Granulocytes: 0 %
LYMPHS ABS: 1.4 10*3/uL (ref 0.7–3.1)
Lymphs: 28 %
MCH: 31.5 pg (ref 26.6–33.0)
MCHC: 34.2 g/dL (ref 31.5–35.7)
MCV: 92 fL (ref 79–97)
MONOS ABS: 0.3 10*3/uL (ref 0.1–0.9)
Monocytes: 6 %
NEUTROS ABS: 3.3 10*3/uL (ref 1.4–7.0)
Neutrophils Relative %: 65 %
PLATELETS: 447 10*3/uL — AB (ref 150–379)
RBC: 3.81 x10E6/uL (ref 3.77–5.28)
RDW: 14.4 % (ref 12.3–15.4)
WBC: 5.2 10*3/uL (ref 3.4–10.8)

## 2014-04-22 LAB — LIPASE: Lipase: 12 U/L (ref 0–59)

## 2014-04-22 LAB — HEPATIC FUNCTION PANEL
ALK PHOS: 56 IU/L (ref 39–117)
ALT: 9 IU/L (ref 0–32)
AST: 15 IU/L (ref 0–40)
Albumin: 3.9 g/dL (ref 3.5–5.5)
BILIRUBIN TOTAL: 0.5 mg/dL (ref 0.0–1.2)
Bilirubin, Direct: 0.13 mg/dL (ref 0.00–0.40)
TOTAL PROTEIN: 6.7 g/dL (ref 6.0–8.5)

## 2014-04-22 MED ORDER — PANTOPRAZOLE SODIUM 40 MG PO TBEC: 40.0000 mg | DELAYED_RELEASE_TABLET | Freq: Every day | ORAL | Status: DC

## 2014-04-22 NOTE — Progress Notes (Addendum)
Subjective:  Presents for c/o low back pain x 2 weeks. Localized. No specific history of injury. Worse with prolonged sitting at work. Regular menses, no change in flow. Also upper central abd pain described as an ache. Occasionally worse with greasy foods or tomato products. Off Zantac. No significant reflux. No fever. No nausea or vomiting. Took sea salt and water to "get cleaned out" 2 d ago; had BM none since. No blood in stool. Off caffeine several weeks. Non smoker. No alcohol or NSAID use.   Objective:   BP 122/82 mmHg  Temp(Src) 98.5 F (36.9 C)  Ht 5' 5.5" (1.664 m)  Wt 207 lb (93.895 kg)  BMI 33.91 kg/m2  LMP 04/14/2014 NAD. Alert, oriented. Lungs clear. No CVA or flank tenderness. Heart RRR. Abdomen soft, non distended with active BS. Moderate epigastric and  RUQ area tenderness. No hepatomegaly noted. Mild tenderness right SI area. SLR neg. Gait nl. Reflexes nl lower extremities. UA pos for ketones otherwise neg.   Assessment: Bilateral low back pain without sciatica - Plan: POCT urinalysis dipstick  RUQ abdominal pain - Plan: CBC with Differential/Platelet, Lipase, US Abdomen Limited RUQ  Gastroesophageal reflux disease without esophagitis - Plan: Lipase  High risk medication use - Plan: Hepatic function panel  Essential hypertension - Plan: Basic metabolic panel  Plan:  Meds ordered this encounter  Medications  . pantoprazole (PROTONIX) 40 MG tablet    Sig: Take 1 tablet (40 mg total) by mouth daily. For acid reflux    Dispense:  30 tablet    Refill:  2    Order Specific Question:  Supervising Provider    Answer:  Merlyn AlbertLUKING, WILLIAM S [[id number]]   Start bowel probiotics. Discussed dietary measures for reflux. Ice/heat applications to back area, stretching and OTC TENS unit. Further follow up based on labs and US. Call back sooner if worse.   Note: patient has not been diagnosed with HTN nor is she receiving any treatment for this problem. This was corrected in her medical  record.

## 2014-04-24 ENCOUNTER — Ambulatory Visit (HOSPITAL_COMMUNITY): Payer: BLUE CROSS/BLUE SHIELD

## 2014-04-27 ENCOUNTER — Ambulatory Visit (HOSPITAL_COMMUNITY)
Admission: RE | Admit: 2014-04-27 | Discharge: 2014-04-27 | Disposition: A | Discharge status: Home / Self Care | Payer: BLUE CROSS/BLUE SHIELD | Source: Ambulatory Visit | Attending: Nurse Practitioner | Admitting: Nurse Practitioner

## 2014-04-27 DIAGNOSIS — R1011 Right upper quadrant pain: Secondary | ICD-10-CM

## 2014-07-13 ENCOUNTER — Telehealth: Payer: Self-pay | Admitting: Nurse Practitioner

## 2014-07-13 ENCOUNTER — Other Ambulatory Visit: Payer: Self-pay | Admitting: Nurse Practitioner

## 2014-07-13 MED ORDER — DOXYCYCLINE HYCLATE 100 MG PO TABS: ORAL_TABLET | ORAL | Status: DC

## 2014-07-13 MED ORDER — ADAPALENE 0.1 % EX GEL: CUTANEOUS | Status: DC

## 2014-07-13 MED ORDER — TRIAMCINOLONE ACETONIDE 0.1 % EX CREA: 1.0000 "application " | TOPICAL_CREAM | Freq: Two times a day (BID) | CUTANEOUS | Status: DC

## 2014-07-13 MED ORDER — CLINDAMYCIN PHOSPHATE 1 % EX GEL: CUTANEOUS | Status: DC

## 2014-07-13 NOTE — Telephone Encounter (Signed)
doxycycline (VIBRA-TABS) 100 MG tablet  clindamycin (CLINDAGEL) 1 % gel  triamcinolone cream (KENALOG) 0.1 %  adapalene (DIFFERIN) 0.1 % gel  Pt has appt set for phy w/pap on 7/20, please refill these meds  For her, she is currently out at the moment and can't wait to the  appt for the meds  [facility] [place]

## 2014-08-23 ENCOUNTER — Encounter: Payer: Self-pay | Admitting: Nurse Practitioner

## 2014-08-23 ENCOUNTER — Ambulatory Visit (INDEPENDENT_AMBULATORY_CARE_PROVIDER_SITE_OTHER): Payer: BLUE CROSS/BLUE SHIELD | Admitting: Nurse Practitioner

## 2014-08-23 VITALS — BP 118/80 | Ht 65.5 in | Wt 214.0 lb

## 2014-08-23 DIAGNOSIS — Z1231 Encounter for screening mammogram for malignant neoplasm of breast: Secondary | ICD-10-CM | POA: Diagnosis not present

## 2014-08-23 DIAGNOSIS — L709 Acne, unspecified: Secondary | ICD-10-CM

## 2014-08-23 DIAGNOSIS — Z0000 Encounter for general adult medical examination without abnormal findings: Secondary | ICD-10-CM

## 2014-08-23 DIAGNOSIS — Z124 Encounter for screening for malignant neoplasm of cervix: Secondary | ICD-10-CM | POA: Diagnosis not present

## 2014-08-23 DIAGNOSIS — Z01419 Encounter for gynecological examination (general) (routine) without abnormal findings: Secondary | ICD-10-CM

## 2014-08-23 MED ORDER — MONTELUKAST SODIUM 10 MG PO TABS: ORAL_TABLET | ORAL | Status: DC

## 2014-08-23 MED ORDER — CLINDAMYCIN PHOSPHATE 1 % EX GEL: CUTANEOUS | Status: DC

## 2014-08-23 MED ORDER — PHENTERMINE HCL 37.5 MG PO TABS: 37.5000 mg | ORAL_TABLET | Freq: Every day | ORAL | Status: DC

## 2014-08-23 MED ORDER — ADAPALENE 0.1 % EX GEL: CUTANEOUS | Status: DC

## 2014-08-23 MED ORDER — DOXYCYCLINE HYCLATE 100 MG PO TABS: ORAL_TABLET | ORAL | Status: DC

## 2014-08-25 ENCOUNTER — Telehealth: Payer: Self-pay | Admitting: Family Medicine

## 2014-08-25 LAB — PAP IG W/ RFLX HPV ASCU: PAP SMEAR COMMENT: 0

## 2014-08-25 NOTE — Telephone Encounter (Signed)
Rx prior auth for pt's adapalene (DIFFERIN) 0.1 % gel was APPROVED, expires 08/23/2016

## 2014-08-27 ENCOUNTER — Encounter: Payer: Self-pay | Admitting: Nurse Practitioner

## 2014-08-27 DIAGNOSIS — H9190 Unspecified hearing loss, unspecified ear: Secondary | ICD-10-CM | POA: Insufficient documentation

## 2014-08-27 NOTE — Progress Notes (Signed)
Subjective:    Patient ID: Sherri Fleming, female    DOB: 1975/03/24, 39 y.o.   MRN: [id number]  HPI presents for her wellness exam. Regular cycles, normal flow. No new partners. Regular vision and dental exams. Has been diagnosed with 35% bilat hearing loss; wears hearing aids. Would like to restart Phentermine for weight loss. Has taken fine in the past without side effects.    Review of Systems  Constitutional: Negative for fever, activity change, appetite change and fatigue.  HENT: Negative for dental problem, ear pain, sinus pressure and sore throat.   Respiratory: Negative for cough, chest tightness, shortness of breath and wheezing.   Cardiovascular: Negative for chest pain.  Gastrointestinal: Negative for nausea, vomiting, abdominal pain, diarrhea, constipation and abdominal distention.  Genitourinary: Negative for dysuria, urgency, frequency, vaginal discharge, enuresis, difficulty urinating, genital sores, menstrual problem and pelvic pain.       Objective:   Physical Exam  Constitutional: She is oriented to person, place, and time. She appears well-developed. No distress.  HENT:  Right Ear: External ear normal.  Left Ear: External ear normal.  Nasal boggy and pale. Right TM: clear fluid. Pharynx non erythematous with cloudy PND.   Neck: Normal range of motion. Neck supple. No tracheal deviation present. No thyromegaly present.  Cardiovascular: Normal rate, regular rhythm and normal heart sounds.  Exam reveals no gallop.   No murmur heard. Pulmonary/Chest: Effort normal and breath sounds normal.  Abdominal: Soft. She exhibits no distension. There is no tenderness.  Genitourinary: Vagina normal and uterus normal. No vaginal discharge found.  External GU: no rashes or lesions. Vagina: no discharge. Cervix: light spotting. No CMT. Bimanual exam: no tenderness or obvious masses.  Musculoskeletal: She exhibits no edema.  Lymphadenopathy:    She has no cervical  adenopathy.  Neurological: She is alert and oriented to person, place, and time.  Skin: Skin is warm and dry. Rash noted.  Facial acne around periphery of face, chin and neck.  Psychiatric: She has a normal mood and affect. Her behavior is normal.  Vitals reviewed. Breast exam: areas of dense tissue; no masses; axillae no adenopathy.        Assessment & Plan:   Problem List Items Addressed This Visit      Musculoskeletal and Integument   Acne   Relevant Medications   doxycycline (VIBRA-TABS) 100 MG tablet   adapalene (DIFFERIN) 0.1 % gel   clindamycin (CLINDAGEL) 1 % gel     Other   Morbid obesity   Relevant Medications   phentermine (ADIPEX-P) 37.5 MG tablet    Other Visit Diagnoses    Well woman exam    -  Primary    Relevant Orders    Pap IG w/ reflex to HPV when ASC-U (Completed)    Screening for cervical cancer        Relevant Orders    Pap IG w/ reflex to HPV when ASC-U (Completed)    Encounter for screening mammogram for malignant neoplasm of breast        Relevant Orders    MM DIGITAL SCREENING BILATERAL        Meds ordered this encounter  Medications  . phentermine (ADIPEX-P) 37.5 MG tablet    Sig: Take 1 tablet (37.5 mg total) by mouth daily before breakfast.    Dispense:  30 tablet    Refill:  2    Order Specific Question:  Supervising Provider    Answer:  Merlyn Albert [[id number]]  .  doxycycline (VIBRA-TABS) 100 MG tablet    Sig: TAKE ONE TABLET BY MOUTH TWICE DAILY PRN ACNE    Dispense:  60 tablet    Refill:  2    Order Specific Question:  Supervising Provider    Answer:  Merlyn Albert [[id number]]  . adapalene (DIFFERIN) 0.1 % gel    Sig: APPLY TO ACNE AT BEDTIME AS NEEDED    Dispense:  45 g    Refill:  2    Hold until patient wants refill    Order Specific Question:  Supervising Provider    Answer:  Merlyn Albert [[id number]]  . clindamycin (CLINDAGEL) 1 % gel    Sig: APPLY TWICE DAILY TO AFFECTED AREA    Dispense:  75 g    Refill:  2     Hold until patient requests refill    Order Specific Question:  Supervising Provider    Answer:  Merlyn Albert [[id number]]  . montelukast (SINGULAIR) 10 MG tablet    Sig: TAKE ONE TABLET BY MOUTH EVERY DAY IN THE EVENING AS NEEDED FOR ALLERGIES/ASTHMA.    Dispense:  30 tablet    Refill:  11    Order Specific Question:  Supervising Provider    Answer:  Merlyn Albert [[id number]]  Restart acne medication. Recommend regular exercise, healthy diet and daily vitamin D and calcium. Return in about 1 year (around 08/23/2015) for physical.

## 2014-09-11 ENCOUNTER — Ambulatory Visit (HOSPITAL_COMMUNITY)
Admission: RE | Admit: 2014-09-11 | Discharge: 2014-09-11 | Disposition: A | Discharge status: Home / Self Care | Payer: BLUE CROSS/BLUE SHIELD | Source: Ambulatory Visit | Attending: Nurse Practitioner | Admitting: Nurse Practitioner

## 2014-09-11 DIAGNOSIS — Z1231 Encounter for screening mammogram for malignant neoplasm of breast: Secondary | ICD-10-CM | POA: Diagnosis present

## 2014-11-22 ENCOUNTER — Ambulatory Visit: Payer: BLUE CROSS/BLUE SHIELD | Admitting: Nurse Practitioner

## 2015-01-16 ENCOUNTER — Telehealth: Payer: Self-pay | Admitting: Family Medicine

## 2015-01-16 NOTE — Telephone Encounter (Signed)
Rx prior auth APPROVED for pt's phentermine (ADIPEX-P) 37.5 MG tablet, ref# [id number], valid 01/09/15-04/03/15, pt is limited to 12 weeks of therapy per year per [employer]

## 2015-01-31 NOTE — Progress Notes (Unsigned)
Glucose Screening: Random 100 mg. Per Marcelline DeistSandra RakestrawRN

## 2015-05-26 ENCOUNTER — Telehealth: Payer: BLUE CROSS/BLUE SHIELD | Admitting: Physician Assistant

## 2015-05-26 DIAGNOSIS — M549 Dorsalgia, unspecified: Secondary | ICD-10-CM

## 2015-05-26 MED ORDER — BACLOFEN 10 MG PO TABS: 10.0000 mg | ORAL_TABLET | Freq: Three times a day (TID) | ORAL | Status: DC

## 2015-05-26 MED ORDER — NAPROXEN 500 MG PO TABS: 500.0000 mg | ORAL_TABLET | Freq: Two times a day (BID) | ORAL | Status: DC

## 2015-05-26 NOTE — Progress Notes (Signed)

## 2015-06-27 ENCOUNTER — Encounter: Payer: Self-pay | Admitting: Family Medicine

## 2015-06-27 ENCOUNTER — Ambulatory Visit (INDEPENDENT_AMBULATORY_CARE_PROVIDER_SITE_OTHER): Payer: BLUE CROSS/BLUE SHIELD | Admitting: Family Medicine

## 2015-06-27 ENCOUNTER — Ambulatory Visit (HOSPITAL_COMMUNITY)
Admission: RE | Admit: 2015-06-27 | Discharge: 2015-06-27 | Disposition: A | Discharge status: Home / Self Care | Payer: BLUE CROSS/BLUE SHIELD | Source: Ambulatory Visit | Attending: Family Medicine | Admitting: Family Medicine

## 2015-06-27 VITALS — BP 120/70 | Ht 65.5 in | Wt 225.0 lb

## 2015-06-27 DIAGNOSIS — X58XXXA Exposure to other specified factors, initial encounter: Secondary | ICD-10-CM | POA: Insufficient documentation

## 2015-06-27 DIAGNOSIS — M25561 Pain in right knee: Secondary | ICD-10-CM | POA: Diagnosis not present

## 2015-06-27 DIAGNOSIS — R0789 Other chest pain: Secondary | ICD-10-CM

## 2015-06-27 DIAGNOSIS — S8000XA Contusion of unspecified knee, initial encounter: Secondary | ICD-10-CM | POA: Diagnosis not present

## 2015-06-27 DIAGNOSIS — T1490XA Injury, unspecified, initial encounter: Secondary | ICD-10-CM

## 2015-06-27 DIAGNOSIS — T1490 Injury, unspecified: Secondary | ICD-10-CM | POA: Insufficient documentation

## 2015-06-27 DIAGNOSIS — S8991XA Unspecified injury of right lower leg, initial encounter: Secondary | ICD-10-CM | POA: Diagnosis not present

## 2015-06-27 DIAGNOSIS — M25562 Pain in left knee: Secondary | ICD-10-CM | POA: Diagnosis not present

## 2015-06-27 DIAGNOSIS — S8992XA Unspecified injury of left lower leg, initial encounter: Secondary | ICD-10-CM | POA: Diagnosis not present
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
## 2015-10-02 ENCOUNTER — Encounter: Payer: Self-pay | Admitting: Family Medicine

## 2015-10-02 ENCOUNTER — Ambulatory Visit (INDEPENDENT_AMBULATORY_CARE_PROVIDER_SITE_OTHER): Payer: BLUE CROSS/BLUE SHIELD | Admitting: Family Medicine

## 2015-10-02 VITALS — BP 124/80 | Ht 64.0 in | Wt 230.0 lb

## 2015-10-02 DIAGNOSIS — Z0000 Encounter for general adult medical examination without abnormal findings: Secondary | ICD-10-CM

## 2015-10-02 DIAGNOSIS — J4520 Mild intermittent asthma, uncomplicated: Secondary | ICD-10-CM | POA: Diagnosis not present

## 2015-10-02 DIAGNOSIS — N907 Vulvar cyst: Secondary | ICD-10-CM | POA: Diagnosis not present

## 2015-10-02 DIAGNOSIS — L709 Acne, unspecified: Secondary | ICD-10-CM

## 2015-10-02 MED ORDER — CLINDAMYCIN PHOSPHATE 1 % EX GEL: CUTANEOUS | 5 refills | Status: DC

## 2015-10-02 MED ORDER — ADAPALENE 0.1 % EX GEL: CUTANEOUS | 5 refills | Status: DC

## 2015-10-02 MED ORDER — PHENTERMINE HCL 37.5 MG PO TABS: 37.5000 mg | ORAL_TABLET | Freq: Every day | ORAL | 2 refills | Status: DC

## 2015-10-02 MED ORDER — MONTELUKAST SODIUM 10 MG PO TABS: ORAL_TABLET | ORAL | 5 refills | Status: DC

## 2015-10-02 MED ORDER — ALBUTEROL SULFATE HFA 108 (90 BASE) MCG/ACT IN AERS: 2.0000 | INHALATION_SPRAY | RESPIRATORY_TRACT | 5 refills | Status: DC | PRN

## 2015-10-02 MED ORDER — DOXYCYCLINE HYCLATE 100 MG PO TABS: ORAL_TABLET | ORAL | 5 refills | Status: DC

## 2015-10-02 MED ORDER — CETIRIZINE HCL 10 MG PO TABS: 10.0000 mg | ORAL_TABLET | Freq: Every day | ORAL | 5 refills | Status: DC
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
## 2015-10-23 ENCOUNTER — Encounter: Payer: Self-pay | Admitting: Family Medicine

## 2015-11-12 ENCOUNTER — Encounter: Payer: Self-pay | Admitting: Obstetrics and Gynecology

## 2015-11-12 ENCOUNTER — Ambulatory Visit (INDEPENDENT_AMBULATORY_CARE_PROVIDER_SITE_OTHER): Payer: BLUE CROSS/BLUE SHIELD | Admitting: Obstetrics and Gynecology

## 2015-11-12 VITALS — BP 114/68 | HR 78 | Ht 65.5 in | Wt 224.6 lb

## 2015-11-12 DIAGNOSIS — N750 Cyst of Bartholin's gland: Secondary | ICD-10-CM

## 2015-11-12 NOTE — Progress Notes (Signed)
[facility] Visit  11/12/15           Patient name: Sherri Fleming MRN [id number]  Date of birth: [birth year]  CC & HPI:  Sherri Fleming is a 40 y.o. female presenting today for follow up of a left labial cyst. She was seen by Dr. Gerda DissLuking on 10/02/15 and was advised to let the area resolve without medical intervention. She reports doing warm soaks with resolution. She denies any other associated symptoms or complaints at this time.   ROS:  ROS +left labial cyst Otherwise negative   Pertinent History Reviewed:   Reviewed: Significant for  Medical         Past Medical History:  Diagnosis Date  . Acne   . Asthma   . Hearing loss                               Surgical Hx:    Past Surgical History:  Procedure Laterality Date  . HAMMER TOE SURGERY     Medications: Reviewed & Updated - see associated section                       Current Outpatient Prescriptions:  .  adapalene (DIFFERIN) 0.1 % gel, APPLY TO ACNE AT BEDTIME AS NEEDED, Disp: 45 g, Rfl: 5 .  albuterol (PROVENTIL HFA;VENTOLIN HFA) 108 (90 Base) MCG/ACT inhaler, Inhale 2 puffs into the lungs every 4 (four) hours as needed for wheezing., Disp: 1 Inhaler, Rfl: 5 .  cetirizine (ZYRTEC) 10 MG tablet, Take 1 tablet (10 mg total) by mouth daily., Disp: 30 tablet, Rfl: 5 .  clindamycin (CLINDAGEL) 1 % gel, APPLY TWICE DAILY TO AFFECTED AREA, Disp: 75 g, Rfl: 5 .  doxycycline (VIBRA-TABS) 100 MG tablet, TAKE ONE TABLET BY MOUTH TWICE DAILY PRN ACNE, Disp: 60 tablet, Rfl: 5 .  montelukast (SINGULAIR) 10 MG tablet, TAKE ONE TABLET BY MOUTH EVERY DAY IN THE EVENING AS NEEDED FOR ALLERGIES/ASTHMA., Disp: 30 tablet, Rfl: 5 .  phentermine (ADIPEX-P) 37.5 MG tablet, Take 1 tablet (37.5 mg total) by mouth daily before breakfast., Disp: 30 tablet, Rfl: 2 .  polyethylene glycol powder (GLYCOLAX/MIRALAX) powder, Reported on 06/27/2015, Disp: , Rfl:  .  triamcinolone cream (KENALOG) 0.1 %, Apply 1 application topically 2 (two)  times daily. Apply for up to two weeks at a time., Disp: 30 g, Rfl: 0   Social History: Reviewed -  reports that she has never smoked. She has never used smokeless tobacco.  Objective Findings:  Vitals: Blood pressure 114/68, pulse 78, height 5' 5.5" (1.664 m), weight 224 lb 9.6 oz (101.9 kg), last menstrual period 10/24/2015.  Physical Examination: General appearance - alert, well appearing, and in no distress and oriented to person, place, and time Mental status - alert, oriented to person, place, and time, normal mood, behavior, speech, dress, motor activity, and thought processes Pelvic - VULVA: normal appearing vulva with no masses, tenderness or lesions, no swelling    Assessment & Plan:   A:  1. Resolved bartholin's cyst  P:  1. Follow up if area becomes symptomatic 2. Discussed technique word catheter placement and when it would be applicable.     By signing my name below, I, Sonum Patel, attest that this documentation has been prepared under the direction and in the presence of Tilda BurrowJohn V Rickelle Sylvestre, MD. Electronically Signed: Sonum Patel, Neurosurgeoncribe. 11/12/15. 9:13 AM.  I personally performed the services described in this documentation, which was SCRIBED in my presence. The recorded information has been reviewed and considered accurate. It has been edited as necessary during review. Tilda Burrow, MD

## 2015-12-24 ENCOUNTER — Ambulatory Visit (INDEPENDENT_AMBULATORY_CARE_PROVIDER_SITE_OTHER): Payer: BLUE CROSS/BLUE SHIELD | Admitting: Family Medicine

## 2015-12-24 ENCOUNTER — Encounter: Payer: Self-pay | Admitting: Family Medicine

## 2015-12-24 DIAGNOSIS — G44229 Chronic tension-type headache, not intractable: Secondary | ICD-10-CM

## 2015-12-24 MED ORDER — PHENTERMINE-TOPIRAMATE ER 7.5-46 MG PO CP24: ORAL_CAPSULE | ORAL | 2 refills | Status: DC

## 2015-12-24 MED ORDER — PHENTERMINE-TOPIRAMATE ER 3.75-23 MG PO CP24: ORAL_CAPSULE | ORAL | 0 refills | Status: DC

## 2015-12-24 NOTE — Progress Notes (Signed)
   Subjective:    Patient ID: Sherri Fleming, female    DOB: [birth year], 40 y.o.   MRN: [id number]  HPIFollow up on weight. Not taking phentermine because it caused palpitations. Ha d true heart palpitations, used it bofore and it was fine, but hthis time sig prob with it.  Took it for one month.  Started dieting and exercising, pt drives a lot, but unable to  Do much exercise, watchign diet as far as fast food, has lost seven pounds    Follow up on labial cyst. Saw ob/gyn on 10/9. Resolved on its own, had hx of labial cyst. Sitz   Headaches for the past week. Usually when waking in the am.  Using base floor heat, took ibuprofen but not as bad now on the gas  Frontal h a , throbbing  No true hx of migr but pos lthrobbing h a's  ibu 800 prn  No nausea no disturbanfes     Review of Systems No headache, no major weight loss or weight gain, no chest pain no back pain abdominal pain no change in bowel habits complete ROS otherwise negative     Objective:   Physical Exam  BMI 36 Alert vitals stable, NAD. Blood pressure good on repeat. HEENT normal. Lungs clear. Heart regular rate and rhythm.       Assessment & Plan:  Impression 1 morbid obesity #2 headaches patient feels triggered by her electric heat just change to gassy. Somewhat better now that she is change. Responded to ibuprofen plan would like to try something different try qsymia. Side effects benefits discussed exercise and diet discussed

## 2016-02-01 ENCOUNTER — Telehealth: Payer: Self-pay | Admitting: Family Medicine

## 2016-02-01 MED ORDER — CEFPROZIL 500 MG PO TABS: 500.0000 mg | ORAL_TABLET | Freq: Two times a day (BID) | ORAL | 0 refills | Status: DC

## 2016-02-01 NOTE — Telephone Encounter (Signed)
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   [facility]    Please advise

## 2016-02-01 NOTE — Telephone Encounter (Signed)
I would recommend Cefzil 500 mg 1 twice a day for 5 days if ongoing troubles follow-up if high fevers severe abdominal pain or vomiting go to ER

## 2016-02-01 NOTE — Telephone Encounter (Signed)
Med sent to pharmacy. Patient was notified.  

## 2016-02-01 NOTE — Telephone Encounter (Signed)
Pt works at [employer] office and states she has to hold her urine. Started having urgency to urine and odor to urine 2 days ago, no fever, no abd pain, no back pain, she did u/a on her own urine. Had plus 3 on leukocytes and trace of blood. Using cranberry juice

## 2016-03-20 ENCOUNTER — Ambulatory Visit: Payer: BLUE CROSS/BLUE SHIELD | Admitting: Family Medicine

## 2016-03-21 ENCOUNTER — Encounter: Payer: Self-pay | Admitting: Family Medicine

## 2016-03-27 DIAGNOSIS — J029 Acute pharyngitis, unspecified: Secondary | ICD-10-CM | POA: Diagnosis not present

## 2016-08-14 ENCOUNTER — Telehealth: Payer: Self-pay | Admitting: *Deleted
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
## 2016-08-14 NOTE — Telephone Encounter (Signed)
Tried to call no answer

## 2016-08-18 ENCOUNTER — Telehealth: Payer: Self-pay | Admitting: *Deleted

## 2016-08-18 NOTE — Telephone Encounter (Signed)
I called patient to make her aware no answer left a message.

## 2016-08-18 NOTE — Telephone Encounter (Signed)
Patient called checking on the letter. Please advise [phone]

## 2016-08-18 NOTE — Telephone Encounter (Signed)
Call pt, tell her she can pick it up tomorrow will do tonight

## 2016-08-27 ENCOUNTER — Encounter: Payer: BLUE CROSS/BLUE SHIELD | Admitting: Family Medicine

## 2016-09-17 ENCOUNTER — Encounter: Payer: Self-pay | Admitting: Family Medicine

## 2016-09-17 ENCOUNTER — Ambulatory Visit (INDEPENDENT_AMBULATORY_CARE_PROVIDER_SITE_OTHER): Payer: BLUE CROSS/BLUE SHIELD | Admitting: Family Medicine

## 2016-09-17 VITALS — BP 110/70 | Ht 64.0 in | Wt 235.5 lb

## 2016-09-17 DIAGNOSIS — J4540 Moderate persistent asthma, uncomplicated: Secondary | ICD-10-CM

## 2016-09-17 DIAGNOSIS — J4530 Mild persistent asthma, uncomplicated: Secondary | ICD-10-CM

## 2016-09-17 DIAGNOSIS — Z0000 Encounter for general adult medical examination without abnormal findings: Secondary | ICD-10-CM | POA: Diagnosis not present

## 2016-09-17 MED ORDER — CETIRIZINE HCL 10 MG PO TABS: 10.0000 mg | ORAL_TABLET | Freq: Every day | ORAL | 5 refills | Status: DC

## 2016-09-17 MED ORDER — MONTELUKAST SODIUM 10 MG PO TABS: ORAL_TABLET | ORAL | 5 refills | Status: DC

## 2016-09-17 MED ORDER — PHENTERMINE HCL 37.5 MG PO TABS: 37.5000 mg | ORAL_TABLET | Freq: Every day | ORAL | 2 refills | Status: DC

## 2016-09-17 MED ORDER — ALBUTEROL SULFATE HFA 108 (90 BASE) MCG/ACT IN AERS: 2.0000 | INHALATION_SPRAY | RESPIRATORY_TRACT | 5 refills | Status: DC | PRN
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
## 2016-11-08 ENCOUNTER — Other Ambulatory Visit: Payer: Self-pay | Admitting: Family Medicine

## 2016-12-04 ENCOUNTER — Other Ambulatory Visit: Payer: Self-pay | Admitting: Family Medicine

## 2016-12-04 DIAGNOSIS — Z1231 Encounter for screening mammogram for malignant neoplasm of breast: Secondary | ICD-10-CM

## 2016-12-05 ENCOUNTER — Ambulatory Visit: Payer: BLUE CROSS/BLUE SHIELD

## 2016-12-05 ENCOUNTER — Ambulatory Visit (HOSPITAL_COMMUNITY): Payer: BLUE CROSS/BLUE SHIELD

## 2016-12-05 ENCOUNTER — Ambulatory Visit (HOSPITAL_COMMUNITY)
Admission: RE | Admit: 2016-12-05 | Discharge: 2016-12-05 | Disposition: A | Discharge status: Home / Self Care | Payer: BLUE CROSS/BLUE SHIELD | Source: Ambulatory Visit | Attending: Family Medicine | Admitting: Family Medicine

## 2016-12-05 ENCOUNTER — Other Ambulatory Visit (HOSPITAL_COMMUNITY)
Admission: RE | Admit: 2016-12-05 | Discharge: 2016-12-05 | Disposition: A | Discharge status: Home / Self Care | Payer: BLUE CROSS/BLUE SHIELD | Source: Ambulatory Visit | Attending: Family Medicine | Admitting: Family Medicine

## 2016-12-05 DIAGNOSIS — Z1231 Encounter for screening mammogram for malignant neoplasm of breast: Secondary | ICD-10-CM | POA: Diagnosis not present

## 2016-12-05 DIAGNOSIS — Z0000 Encounter for general adult medical examination without abnormal findings: Secondary | ICD-10-CM | POA: Insufficient documentation

## 2016-12-05 LAB — LIPID PANEL
CHOLESTEROL: 159 mg/dL (ref 0–200)
HDL: 44 mg/dL (ref >40)
LDL Cholesterol: 105 mg/dL — ABNORMAL HIGH (ref 0–99)
TRIGLYCERIDES: 52 mg/dL (ref <150)
Total CHOL/HDL Ratio: 3.6 RATIO
VLDL: 10 mg/dL (ref 0–40)

## 2016-12-05 LAB — TSH: TSH: 0.787 u[IU]/mL (ref 0.350–4.500)

## 2016-12-05 LAB — HEPATIC FUNCTION PANEL
ALK PHOS: 60 U/L (ref 38–126)
ALT: 13 U/L — AB (ref 14–54)
AST: 19 U/L (ref 15–41)
Albumin: 3.4 g/dL — ABNORMAL LOW (ref 3.5–5.0)
BILIRUBIN DIRECT: 0.1 mg/dL (ref 0.1–0.5)
BILIRUBIN INDIRECT: 0.7 mg/dL (ref 0.3–0.9)
Total Bilirubin: 0.8 mg/dL (ref 0.3–1.2)
Total Protein: 7.4 g/dL (ref 6.5–8.1)

## 2016-12-05 LAB — BASIC METABOLIC PANEL
Anion gap: 8 (ref 5–15)
BUN: 9 mg/dL (ref 6–20)
CALCIUM: 9 mg/dL (ref 8.9–10.3)
CO2: 25 mmol/L (ref 22–32)
Chloride: 102 mmol/L (ref 101–111)
Creatinine, Ser: 0.91 mg/dL (ref 0.44–1.00)
GFR calc Af Amer: >60 mL/min (ref >60)
GLUCOSE: 95 mg/dL (ref 65–99)
Potassium: 3.8 mmol/L (ref 3.5–5.1)
Sodium: 135 mmol/L (ref 135–145)

## 2016-12-09 ENCOUNTER — Encounter: Payer: Self-pay | Admitting: Family Medicine

## 2016-12-10 ENCOUNTER — Telehealth: Payer: Self-pay | Admitting: Family Medicine

## 2016-12-10 NOTE — Telephone Encounter (Signed)
Patient got mammogram results through MyChart and it noted that she had dense breast tissue and further testing may be needed.  She wants to know if Dr. Brett CanalesSteve has these results and also what he recommends?  Also, requesting lab results.

## 2016-12-10 NOTE — Telephone Encounter (Signed)
A full one half of all women who get a mammo are now being sdvised of this, experts do not recommend that this means that fifty per cent of women need additional testing, only if a lump is felt.   Or other situation (like hx of cancer already in pt) warrants very aggressive testing.However, yrly mammos strongly encouraged  Can look up on google the [facility] pt edfucation info on dense breasts helpful  See blood work result

## 2016-12-10 NOTE — Telephone Encounter (Signed)
I called and left a vm asked that she r/c Also mailed her lab results that stateThyroid,kidney,and liver fxand sugar are fine, Lipid profile is overall better that avg,so this is good. Mailed 12/10/2016

## 2016-12-10 NOTE — Telephone Encounter (Signed)
Please advise 

## 2016-12-11 ENCOUNTER — Ambulatory Visit (INDEPENDENT_AMBULATORY_CARE_PROVIDER_SITE_OTHER): Payer: BLUE CROSS/BLUE SHIELD | Admitting: Family Medicine

## 2016-12-11 ENCOUNTER — Ambulatory Visit (HOSPITAL_COMMUNITY)
Admission: RE | Admit: 2016-12-11 | Discharge: 2016-12-11 | Disposition: A | Discharge status: Home / Self Care | Payer: BLUE CROSS/BLUE SHIELD | Source: Ambulatory Visit | Attending: Family Medicine | Admitting: Family Medicine

## 2016-12-11 ENCOUNTER — Encounter: Payer: Self-pay | Admitting: Family Medicine

## 2016-12-11 VITALS — BP 122/84 | Temp 98.4°F | Wt 233.2 lb

## 2016-12-11 DIAGNOSIS — M25561 Pain in right knee: Secondary | ICD-10-CM | POA: Insufficient documentation

## 2016-12-11 DIAGNOSIS — J029 Acute pharyngitis, unspecified: Secondary | ICD-10-CM

## 2016-12-11 LAB — POCT RAPID STREP A (OFFICE): Rapid Strep A Screen: NEGATIVE

## 2016-12-11 NOTE — Progress Notes (Signed)
   Subjective:    Patient ID: Sherri Fleming, female    DOB: [birth year], 41 y.o.   MRN: [id number] Patient several patient arrives with several distinct concerns.  Progressive over the last several months. Sore Throat   This is a new problem. The current episode started in the past 7 days. Associated symptoms include coughing, ear pain and headaches. Associated symptoms comments: wheezing.    Pt was arpund sick pt but not a lot   first week felt horrible, then got better, then got hit with hard cough worse at night plus deep cough pus wheeezing   Accident march of 2017  xrays at that time  Last twenty four hrs throat painful, some tonsil stones on th left side  Some headche las couple days but not tody   Right knee painful , worse behneath the kne cap at the front part of the knee, painful with significan t pressure sharp at times when moving.  Progressive past several months   Patient also has concerns of right knee pain.  Results for orders placed or performed in visit on 12/11/16  POCT rapid strep A  Result Value Ref Range   Rapid Strep A Screen Negative Negative   Patient very worried about her mammogram results.  Patient was advised through a letter that her breasts were dense.  Now she feels she might feel some nodularity in her breasts.  Worried about this  Review of Systems  HENT: Positive for ear pain.   Respiratory: Positive for cough.   Neurological: Positive for headaches.       Objective:   Physical Exam  Alert and oriented, vitals reviewed and stable, NAD ENT-TM's and ext canals, pharynx erythematous tender anterior glands WNL bilat via otoscopic exam Soft palate, tonsils and post pharynx WNL via oropharyngeal exam Neck-symmetric, no masses; thyroid nonpalpable and nontender Pulmonary-no tachypnea or accessory muscle use; Clear without wheezes via auscultation Card--no abnrml murmurs, rhythm reg and rate WNL Carotid pulses symmetric, without  bruits Right knee positive crepitations anterior no joint laxity some tenderness anteriorly     Assessment & Plan:  Impression 1 right anterior knee compartment bursitis/prepatellar bursitis discussed  2.  Post viral pharyngitis/lymphadenitis discussed at length  3.  Dense breast via mammogram discussed at length.  Antibiotic prescribed. Component initiate.  X-ray of both knee.  Follow-up in couple weeks for reassessment/mammogram results discussed at great length  Greater than 50% of this 25 minute face to face visit was spent in counseling and discussion and coordination of care regarding the above diagnosis/diagnosies

## 2016-12-11 NOTE — Telephone Encounter (Signed)
Patient advised per Dr Brett CanalesSteve : A full one half of all women who get a mammo are now being sdvised of this, experts do not recommend that this means that fifty per cent of women need additional testing, only if a lump is felt.   Or other situation (like hx of cancer already in pt) warrants very aggressive testing.However, yrly mammos strongly encouraged. Patient also advised her blood work was good and a copy of the letter and results were sent to the patient. Patient verbalized understanding.

## 2016-12-12 ENCOUNTER — Telehealth: Payer: Self-pay

## 2016-12-12 LAB — SPECIMEN STATUS REPORT

## 2016-12-12 LAB — STREP A DNA PROBE: Strep Gp A Direct, DNA Probe: NEGATIVE

## 2016-12-12 MED ORDER — AMOXICILLIN 500 MG PO CAPS: 500.0000 mg | ORAL_CAPSULE | Freq: Three times a day (TID) | ORAL | 0 refills | Status: DC

## 2016-12-12 MED ORDER — DICLOFENAC SODIUM 75 MG PO TBEC: 75.0000 mg | DELAYED_RELEASE_TABLET | Freq: Two times a day (BID) | ORAL | 0 refills | Status: DC

## 2016-12-12 NOTE — Telephone Encounter (Signed)
ok 

## 2016-12-12 NOTE — Telephone Encounter (Signed)
Pt is aware of all and that the rx have been sent to the [facility].

## 2016-12-12 NOTE — Telephone Encounter (Signed)
I recommend amoxicillin 500 mg 1 3 times daily for 10 days, call if ongoing illness should gradually get better.  If worsening conditions should be rechecked.  As for the x-ray the x-ray was normal this means there is no sign of any type of tumor or fracture in the knee.  May use Voltaren 75 mg 1 twice daily #30 take with the snack-please have patient follow all other directions that Dr. Brett CanalesSteve had for her from that office visit (a copy of this note will be forwarded to Dr. Brett CanalesSteve to keep him in the loop)

## 2016-12-24 ENCOUNTER — Encounter: Payer: Self-pay | Admitting: Family Medicine

## 2016-12-24 ENCOUNTER — Ambulatory Visit: Payer: BLUE CROSS/BLUE SHIELD | Admitting: Family Medicine

## 2016-12-24 VITALS — BP 122/84 | Ht 64.0 in | Wt 232.0 lb

## 2016-12-24 DIAGNOSIS — N6310 Unspecified lump in the right breast, unspecified quadrant: Secondary | ICD-10-CM

## 2016-12-24 DIAGNOSIS — N6315 Unspecified lump in the right breast, overlapping quadrants: Secondary | ICD-10-CM

## 2016-12-24 NOTE — Progress Notes (Signed)
   Subjective:    Patient ID: Sherri Fleming, female    DOB: 06-08-1975, 41 y.o.   MRN: [id number]  Knee Pain   Incident onset: 2 months. There was no injury mechanism. Pain location: right knee    dY NUN [phone] Recheck breast. Pt felt a lump in right breast.  Patient has felt the prominence in upper mid breast.  Nodule.  Generally nontender.  May be slightly worse during periods of her cycle.  She has also discovered this while having her anxiety raised by a state mandated letter regarding her mammogram.  Patient has dense breasts.  Mammogram raise her concerned that something serious might be missed.   Review of Systems No headache, no major weight loss or weight gain, no chest pain no back pain abdominal pain no change in bowel habits complete ROS otherwise negative     Objective:   Physical Exam  Alert vitals stable, NAD. Blood pressure good on repeat. HEENT normal. Lungs clear. Heart regular rate and rhythm. Both breasts are examined carefully both sitting and supine.  Diffuse nodularity noted in both breasts.  There is 1 very mild prominence in the superior 12 PM location right upper mid breast several centimeters above areole.  Area of concern patient.      Assessment & Plan:  Impression diffuse nodularity of both breasts with one questionable nodule right upper mid breast.  Considerable patient anxiety understandably.  Very long discussion held.  We will press on with referral to be 100% sure things are okay  Greater than 50% of this 25 minute face to face visit was spent in counseling and discussion and coordination of care regarding the above diagnosis/diagnosies

## 2016-12-30 ENCOUNTER — Telehealth: Payer: Self-pay | Admitting: Family Medicine

## 2016-12-30 DIAGNOSIS — N63 Unspecified lump in unspecified breast: Secondary | ICD-10-CM

## 2016-12-30 NOTE — Telephone Encounter (Signed)
Diagnostic right breast mammo and right breast u/s scheduled dec 3rd at 8:10 arrive at 7:50 am at the breast center. Pt notified of the appt

## 2016-12-30 NOTE — Telephone Encounter (Signed)
Ok let pt know would like to diagnostic mammo and ultrasound on the area of concern at the breast center, depending on results, may end of recommending pt consult with a breast surgeon. Re for work up small palpable prominence in superior mid right breast

## 2016-12-30 NOTE — Telephone Encounter (Signed)
Please clarify    Referral to general surgery to eval lump in breast?   [facility] has surgeons that specialize in the breast area)   (The Breast Center only does mammograms and ultrasounds, they do not see patient's for consultation)

## 2017-01-05 ENCOUNTER — Other Ambulatory Visit: Payer: Self-pay | Admitting: Family Medicine

## 2017-01-05 ENCOUNTER — Ambulatory Visit
Admission: RE | Admit: 2017-01-05 | Discharge: 2017-01-05 | Disposition: A | Discharge status: Home / Self Care | Payer: BLUE CROSS/BLUE SHIELD | Source: Ambulatory Visit | Attending: Family Medicine | Admitting: Family Medicine

## 2017-01-05 DIAGNOSIS — N63 Unspecified lump in unspecified breast: Secondary | ICD-10-CM

## 2017-01-05 DIAGNOSIS — N6489 Other specified disorders of breast: Secondary | ICD-10-CM | POA: Diagnosis not present

## 2017-01-05 DIAGNOSIS — R922 Inconclusive mammogram: Secondary | ICD-10-CM | POA: Diagnosis not present

## 2017-01-19 ENCOUNTER — Telehealth: Payer: Self-pay | Admitting: Family Medicine

## 2017-01-19 NOTE — Telephone Encounter (Signed)
See mammo results- Dr Lorin PicketScott rec office visit with you for recheck

## 2017-01-19 NOTE — Telephone Encounter (Signed)
Dr Brett CanalesSteve stated he reviewed the reports and recommended an appointment with a breast surgeon. Advised patient of Dr Soyla DryerSteve's recommendations and she does not want referral to breast surgeon and will keep her appointment here to discuss-patient states the mammo people told her everything was normal and she just needed to repeat mammo in one year so she does not understand our recommendations.

## 2017-01-19 NOTE — Telephone Encounter (Signed)
Patient has an appointment on 01/23/17 with Dr. Brett CanalesSteve.  She said that a nurse was going to check with Dr. Brett CanalesSteve to see if this appointment was necessary because she has already been given the information about her breast exam, but she has not heard anything back about keeping the appiontment.  Please advise.

## 2017-01-20 ENCOUNTER — Ambulatory Visit: Payer: BLUE CROSS/BLUE SHIELD | Admitting: Family Medicine

## 2017-01-20 ENCOUNTER — Encounter: Payer: Self-pay | Admitting: Family Medicine

## 2017-01-20 VITALS — BP 136/74 | Ht 64.0 in | Wt 229.0 lb

## 2017-01-20 DIAGNOSIS — R922 Inconclusive mammogram: Secondary | ICD-10-CM | POA: Diagnosis not present

## 2017-01-20 DIAGNOSIS — M25561 Pain in right knee: Secondary | ICD-10-CM

## 2017-01-20 DIAGNOSIS — R9230 Dense breasts, unspecified: Secondary | ICD-10-CM

## 2017-01-20 NOTE — Progress Notes (Signed)
   Subjective:    Patient ID: Sherri CorpusBarbara A Brisco, female    DOB: 21-Feb-1975, 41 y.o.   MRN: [id number]  HPIFollow up test results on Mammogram and breast ultrasound.   Right knee pain. Started in October. Tried diclofenac. Had xray in November.   Knee will be fine certain things are causing the knee to be very painful  Pain in the knee very sig at times  Worse with certain motion  Exercise walking  Walking on street and not having discomfort   Hurts only when sitting         Review of Systems No headache, no major weight loss or weight gain, no chest pain no back pain abdominal pain no change in bowel habits complete ROS otherwise negative     Objective:   Physical Exam  Alert vitals stable, NAD. Blood pressure good on repeat. HEENT normal. Lungs clear. Heart regular rate and rhythm.       Assessment & Plan:  Dense breasts very long discussion concerning original mammogram.  Patient notes her original mammogram in August because of the state mandated statement regarding density of breast.  Patient states she carefully checked her breasts at that point and admitted to feel multiple areas of fine nodularity.  After going in and seen the last mammographer and having a diagnostic mammogram and her most predominant area of concern.  Patient felt much better.  She also notes that she really cannot feel this area of her breast at this time.  And notes that it did seem to get worse around menstrual cycles.  Also notes no pain or discomfort.  At this time with a normal ultrasound and normal mammogram patient wishes to do no further testing.  I advised the patient frankly I never really could feel a dominant mass in her original area of concern.  I honored her concerns and referred her onward.  To me both of her breasts felt diffuse nodularity.  I feel at this time no further workup is warranted.  Yearly mammograms are important and this is reemphasized

## 2017-01-23 ENCOUNTER — Ambulatory Visit: Payer: BLUE CROSS/BLUE SHIELD | Admitting: Family Medicine

## 2017-02-10 ENCOUNTER — Encounter: Payer: Self-pay | Admitting: Orthopaedic Surgery

## 2017-02-10 ENCOUNTER — Ambulatory Visit: Payer: BLUE CROSS/BLUE SHIELD | Admitting: Orthopaedic Surgery

## 2017-02-10 VITALS — BP 129/81 | HR 94 | Ht 64.0 in | Wt 229.0 lb

## 2017-02-10 DIAGNOSIS — G8929 Other chronic pain: Secondary | ICD-10-CM

## 2017-02-10 DIAGNOSIS — M25561 Pain in right knee: Secondary | ICD-10-CM | POA: Diagnosis not present

## 2017-02-10 NOTE — Progress Notes (Signed)
Subjective:    Patient ID: Sherri Fleming, female    DOB: 12-06-1975, 42 y.o.   MRN: [id number]  HPI She has had pain in the right knee for over three months.  She has been seen by Dr. Gerda DissLuking.  I have read his notes.  She had x-rays of the right knee in November and had a trial of diclofenac which helped a little.  She has swelling in the right knee, popping and the feeling it might give way but has not. She has no trauma, no redness, no numbness.  She says her knee just does not feel right.     Review of Systems  HENT: Positive for hearing loss. Negative for congestion.   Respiratory: Positive for shortness of breath. Negative for cough.   Cardiovascular: Negative for chest pain and leg swelling.  Musculoskeletal: Positive for arthralgias, gait problem and joint swelling.  All other systems reviewed and are negative.  Past Medical History:  Diagnosis Date  . Acne   . Asthma   . Fractures   . Hearing loss     Past Surgical History:  Procedure Laterality Date  . HAMMER TOE SURGERY      Current Outpatient Medications on File Prior to Visit  Medication Sig Dispense Refill  . adapalene (DIFFERIN) 0.1 % gel APPLY A LAYER TOPICALLY TO ACNE AT BEDTIME AS NEEDED 45 g 5  . albuterol (PROVENTIL HFA;VENTOLIN HFA) 108 (90 Base) MCG/ACT inhaler Inhale 2 puffs into the lungs every 4 (four) hours as needed for wheezing. 1 Inhaler 5  . amoxicillin (AMOXIL) 500 MG capsule Take 1 capsule (500 mg total) 3 (three) times daily by mouth. 30 capsule 0  . cetirizine (ZYRTEC) 10 MG tablet Take 1 tablet (10 mg total) by mouth daily. 30 tablet 5  . clindamycin (CLINDAGEL) 1 % gel APPLY  THIN LAYER TOPICALLY TO AFFECTED AREA TWICE DAILY 60 g 7  . diclofenac (VOLTAREN) 75 MG EC tablet Take 1 tablet (75 mg total) 2 (two) times daily by mouth. Take with a snack. 30 tablet 0  . montelukast (SINGULAIR) 10 MG tablet TAKE ONE TABLET BY MOUTH EVERY DAY IN THE EVENING AS NEEDED FOR ALLERGIES/ASTHMA. 30 tablet  5  . phentermine (ADIPEX-P) 37.5 MG tablet Take 1 tablet (37.5 mg total) by mouth daily before breakfast. 30 tablet 2  . triamcinolone cream (KENALOG) 0.1 % Apply 1 application topically 2 (two) times daily. Apply for up to two weeks at a time. 30 g 0   No current facility-administered medications on file prior to visit.     Social History   Socioeconomic History  . Marital status: Legally Separated    Spouse name: Not on file  . Number of children: Not on file  . Years of education: Not on file  . Highest education level: Not on file  Social Needs  . Financial resource strain: Not on file  . Food insecurity - worry: Not on file  . Food insecurity - inability: Not on file  . Transportation needs - medical: Not on file  . Transportation needs - non-medical: Not on file  Occupational History  . Not on file  Tobacco Use  . Smoking status: Never Smoker  . Smokeless tobacco: Never Used  Substance and Sexual Activity  . Alcohol use: No  . Drug use: No  . Sexual activity: Yes    Partners: Male  Other Topics Concern  . Not on file  Social History Narrative  . Not on file  Family History  Problem Relation Age of Onset  . Diabetes Mother   . Hypertension Mother   . Diabetes Father   . Diabetes Paternal Grandmother   . Kidney disease Paternal Grandmother     BP 129/81   Pulse 94   Ht 5\' 4"  (1.626 m)   Wt 229 lb (103.9 kg)   BMI 39.31 kg/m      Objective:   Physical Exam  Constitutional: She is oriented to person, place, and time. She appears well-developed and well-nourished.  HENT:  Head: Normocephalic and atraumatic.  Eyes: Conjunctivae and EOM are normal. Pupils are equal, round, and reactive to light.  Neck: Normal range of motion. Neck supple.  Cardiovascular: Normal rate, regular rhythm and intact distal pulses.  Pulmonary/Chest: Effort normal.  Abdominal: Soft.  Musculoskeletal: She exhibits tenderness (Right knee tender, ROM 0-110, crepitus, slight  effusion, stable, no limp, NV intact; left knee negative.).  Neurological: She is alert and oriented to person, place, and time. She displays normal reflexes. No cranial nerve deficit. She exhibits normal muscle tone. Coordination normal.  Skin: Skin is warm and dry.  Psychiatric: She has a normal mood and affect. Her behavior is normal. Judgment and thought content normal.  Vitals reviewed.   X-rays and report and Dr. Gerda Diss notes reviewed.      Assessment & Plan:   Encounter Diagnosis  Name Primary?  . Chronic pain of right knee Yes   PROCEDURE NOTE:  The patient requests injections of the right knee , verbal consent was obtained.  The right knee was prepped appropriately after time out was performed.   Sterile technique was observed and injection of 1 cc of Depo-Medrol 40 mg with several cc's of plain xylocaine. Anesthesia was provided by ethyl chloride and a 20-gauge needle was used to inject the knee area. The injection was tolerated well.  A band aid dressing was applied.  The patient was advised to apply ice later today and tomorrow to the injection sight as needed.  I told her to begin Aleve one bid pc.  Return in two weeks.  If not improved, consider MRI of the right knee.  Call if any problem.  Precautions discussed.   Electronically Signed Darreld Mclean, MD 1/8/20191:57 PM

## 2017-02-24 ENCOUNTER — Ambulatory Visit: Payer: BLUE CROSS/BLUE SHIELD | Admitting: Orthopaedic Surgery

## 2017-02-24 ENCOUNTER — Encounter: Payer: Self-pay | Admitting: Orthopaedic Surgery

## 2017-02-24 VITALS — BP 136/91 | HR 83 | Ht 64.0 in | Wt 230.0 lb

## 2017-02-24 DIAGNOSIS — G8929 Other chronic pain: Secondary | ICD-10-CM | POA: Diagnosis not present

## 2017-02-24 DIAGNOSIS — M25561 Pain in right knee: Secondary | ICD-10-CM | POA: Diagnosis not present

## 2017-02-24 NOTE — Progress Notes (Signed)
Patient UE:AVWUJWJ Sherri Fleming, female DOB:1975/10/17, 43 y.o. XBJ:[id number]  Chief Complaint  Patient presents with  . Knee Pain    right     HPI  Sherri Fleming is Sherri 42 y.o. female who has continued pain of the right knee.  She is better after the injection last time but the knee still hurts and still has swelling and feeling as it will give way.  She is taking her medicine.  I will get MRI as she is not significantly improved. HPI  Body mass index is 39.48 kg/m.  ROS  Review of Systems  HENT: Positive for hearing loss. Negative for congestion.   Respiratory: Positive for shortness of breath. Negative for cough.   Cardiovascular: Negative for chest pain and leg swelling.  Musculoskeletal: Positive for arthralgias, gait problem and joint swelling.  All other systems reviewed and are negative.   Past Medical History:  Diagnosis Date  . Acne   . Asthma   . Fractures   . Hearing loss     Past Surgical History:  Procedure Laterality Date  . HAMMER TOE SURGERY      Family History  Problem Relation Age of Onset  . Diabetes Mother   . Hypertension Mother   . Diabetes Father   . Diabetes Paternal Grandmother   . Kidney disease Paternal Grandmother     Social History Social History   Tobacco Use  . Smoking status: Never Smoker  . Smokeless tobacco: Never Used  Substance Use Topics  . Alcohol use: No  . Drug use: No    No Known Allergies  Current Outpatient Medications  Medication Sig Dispense Refill  . adapalene (DIFFERIN) 0.1 % gel APPLY Sherri LAYER TOPICALLY TO ACNE AT BEDTIME AS NEEDED 45 g 5  . albuterol (PROVENTIL HFA;VENTOLIN HFA) 108 (90 Base) MCG/ACT inhaler Inhale 2 puffs into the lungs every 4 (four) hours as needed for wheezing. 1 Inhaler 5  . cetirizine (ZYRTEC) 10 MG tablet Take 1 tablet (10 mg total) by mouth daily. 30 tablet 5  . clindamycin (CLINDAGEL) 1 % gel APPLY  THIN LAYER TOPICALLY TO AFFECTED AREA TWICE DAILY 60 g 7  . diclofenac  (VOLTAREN) 75 MG EC tablet Take 1 tablet (75 mg total) 2 (two) times daily by mouth. Take with Sherri snack. 30 tablet 0  . montelukast (SINGULAIR) 10 MG tablet TAKE ONE TABLET BY MOUTH EVERY DAY IN THE EVENING AS NEEDED FOR ALLERGIES/ASTHMA. 30 tablet 5  . phentermine (ADIPEX-P) 37.5 MG tablet Take 1 tablet (37.5 mg total) by mouth daily before breakfast. 30 tablet 2  . triamcinolone cream (KENALOG) 0.1 % Apply 1 application topically 2 (two) times daily. Apply for up to two weeks at Sherri time. 30 g 0   No current facility-administered medications for this visit.      Physical Exam  Blood pressure (!) 136/91, pulse 83, height 5\' 4"  (1.626 m), weight 230 lb (104.3 kg).  Constitutional: overall normal hygiene, normal nutrition, well developed, normal grooming, normal body habitus. Assistive device:none  Musculoskeletal: gait and station Limp right, muscle tone and strength are normal, no tremors or atrophy is present.  .  Neurological: coordination overall normal.  Deep tendon reflex/nerve stretch intact.  Sensation normal.  Cranial nerves II-XII intact.   Skin:   Normal overall no scars, lesions, ulcers or rashes. No psoriasis.  Psychiatric: Alert and oriented x 3.  Recent memory intact, remote memory unclear.  Normal mood and affect. Well groomed.  Good eye contact.  Cardiovascular: overall no swelling, no varicosities, no edema bilaterally, normal temperatures of the legs and arms, no clubbing, cyanosis and good capillary refill.  Lymphatic: palpation is normal.  Right knee has pain, crepitus, ROM 0 to 110, positive joint pain medially and positive medial McMurray, limp right.  NV intact.  All other systems reviewed and are negative   The patient has been educated about the nature of the problem(s) and counseled on treatment options.  The patient appeared to understand what I have discussed and is in agreement with it.  Encounter Diagnosis  Name Primary?  . Chronic pain of right knee  Yes    PLAN Call if any problems.  Precautions discussed.  Continue current medications.   Return to clinic after MRI of the right knee.   Electronically Signed Darreld McleanWayne Roan Sawchuk, MD 1/22/20199:36 AM

## 2017-04-21 ENCOUNTER — Telehealth: Payer: Self-pay | Admitting: Radiology

## 2017-04-21 NOTE — Telephone Encounter (Addendum)
I called pt she left message yesterday she wants MRI now wants MRI at [facility] / I have faxed the order. Advised patient to call when she finds out the date of the MRI to make follow up with Dr Romeo AppleHarrison since Dr Hilda LiasKeeling is out of the office, and to make sure she brings in a CD of the images.

## 2017-04-22 ENCOUNTER — Telehealth: Payer: Self-pay | Admitting: Orthopaedic Surgery

## 2017-04-22 NOTE — Telephone Encounter (Signed)
Violet from [employer] called stating that this patient's insurance says that this patient needs pre-authorization for her MRI. Also, stated that according to the insurance [employer] is not in network with them.  If you have any questions please call Violet at [phone].

## 2017-04-22 NOTE — Telephone Encounter (Signed)
Can you look at this for me? This is one you started for Dr Hilda LiasKeeling, patient finally called back this week and had me send order to imaging center in [place].   I will discuss with you too.

## 2017-04-23 ENCOUNTER — Encounter: Payer: Self-pay | Admitting: *Deleted

## 2017-04-23 ENCOUNTER — Other Ambulatory Visit: Payer: Self-pay | Admitting: *Deleted

## 2017-04-23 DIAGNOSIS — M25561 Pain in right knee: Principal | ICD-10-CM

## 2017-04-23 DIAGNOSIS — G8929 Other chronic pain: Secondary | ICD-10-CM

## 2017-04-23 NOTE — Progress Notes (Signed)
CPT 73721 MRI RIGHT LOWER EXTREMITY APPROVED AUTHORIZATION # [id number] RECEIVED 04/23/17 VALID 04/23/17-06/22/17 PER MS.GIBBS WITH [employer]. MRI WILL BE DONE AT [facility]. ORDER FAXED. SCHEDULED FOR 04/23/17 AT 6 PM FOLLOW UP WITH DR. HARRISON 04/28/17 AT 9:30 AM. PATIENT AWARE.

## 2017-04-23 NOTE — Progress Notes (Signed)
PATIENT WAS ADVISED TO BRING MRI DISC FROM [facility] AND TO MAKE SURE THEY FAX THE REPORT BEFORE APPOINTMENT WITH DR. HARRISON ON 04/28/17.

## 2017-04-23 NOTE — Telephone Encounter (Signed)
I called patient and she states prior auth goes through [employer] / I will look at [facility] and see if they will give the approval   I looked at [facility] and they do not have [employer]/ [place] in their list, they do not appear to be the one who handles the prior auth for this plan.   I have asked Tammy to help with this. I am having difficulty.

## 2017-04-28 ENCOUNTER — Encounter: Payer: Self-pay | Admitting: Orthopedic Surgery

## 2017-04-28 ENCOUNTER — Ambulatory Visit: Payer: BLUE CROSS/BLUE SHIELD | Admitting: Orthopedic Surgery

## 2017-04-28 VITALS — BP 133/90 | HR 94 | Ht 64.0 in | Wt 226.0 lb

## 2017-04-28 DIAGNOSIS — G8929 Other chronic pain: Secondary | ICD-10-CM | POA: Diagnosis not present

## 2017-04-28 DIAGNOSIS — M1711 Unilateral primary osteoarthritis, right knee: Secondary | ICD-10-CM

## 2017-04-28 DIAGNOSIS — M25561 Pain in right knee: Secondary | ICD-10-CM | POA: Diagnosis not present

## 2017-04-28 MED ORDER — DICLOFENAC SODIUM 75 MG PO TBEC: 75.0000 mg | DELAYED_RELEASE_TABLET | Freq: Two times a day (BID) | ORAL | 2 refills | Status: DC

## 2017-04-28 NOTE — Progress Notes (Signed)
FOLLOW UP VISIT : MRI RESULTS   Chief Complaint  Patient presents with  . Knee Pain    right   . Results    review MRI      HPI: The patient is here TO DISCUSS THE RESULTS OF MRI  Previous history noted below  The patient returns for evaluation of her MRI; knee pain no better no worse she says she can live with it she does feel cracking and popping and difficulty going up and down the steps.  Currently on no arthritis medication no allergies her blood pressure medicine are recorded    Previous history Sherri Fleming is a 42 y.o. female who has continued pain of the right knee.  She is better after the injection last time but the knee still hurts and still has swelling and feeling as it will give way.  She is taking her medicine.  I will get MRI as she is not significantly improved. HPI   Body mass index is 39.48 kg/m.   ROS   Review of Systems unchanged from January 22nd HENT: Positive for hearing loss. Negative for congestion.   Respiratory: Positive for shortness of breath. Negative for cough.   Cardiovascular: Negative for chest pain and leg swelling.  Musculoskeletal: Positive for arthralgias, gait problem and joint swelling.  All other systems reviewed and are negative.          BP 133/90   Pulse 94   Ht 5\' 4"  (1.626 m)   Wt 226 lb (102.5 kg)   BMI 38.79 kg/m     Medical decision-making section   DATA  MRI REPORT:  Articular cartilage a focus full-thickness cartilage defect with mild cortical indentation is demonstrated within the central aspect of the cartilage of the lateral tibial plateau anteriorly this finding is best appreciated on image 13 coronal T2 fat-saturated and image 9 sagittal proton density fat saturation there is no appreciable marrow edema there is mild cortical indentation the cartilage and the remaining compartments is intact the ACL PCL are intact collateral ligaments are unremarkable the extensor mechanism appears normal  MY  READING: MRI OF THE right knee shows no cartilage or ligament damage.  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  Photos have been placed in the medical record   Encounter Diagnosis  Name Primary?  . Chronic pain of right knee Yes     PLAN:   Knee exercises to strengthen the quadriceps muscles  Start diclofenac 75 mg twice a day  She will see Dr. Hilda LiasKeeling in 6 months for follow-up visit

## 2017-08-31 ENCOUNTER — Telehealth: Payer: Self-pay | Admitting: Orthopaedic Surgery

## 2017-08-31 NOTE — Telephone Encounter (Signed)
Patient called to inquire about having the following medication prescribed in gel, rather than tablets:  diclofenac (VOLTAREN)  [facility] [place]  (aware of next scheduled appointment with Dr Hilda LiasKeeling 10/29/17)

## 2017-09-01 ENCOUNTER — Telehealth: Payer: Self-pay | Admitting: Orthopaedic Surgery

## 2017-09-01 MED ORDER — DICLOFENAC SODIUM 1 % TD GEL: 4.0000 g | Freq: Four times a day (QID) | TRANSDERMAL | 5 refills | Status: DC

## 2017-09-01 NOTE — Telephone Encounter (Signed)
Patient called back and stated that her insurance will not cover the Voltaren Gel.  She said has checked her formulary and it will cover Ketoprofen 10% Gel instead.  She asks that this be sent in for her.  Thanks

## 2017-09-02 MED ORDER — KETOPROFEN 10 % CREA: TOPICAL_CREAM | 5 refills | Status: DC

## 2017-09-04 NOTE — Telephone Encounter (Signed)
We received a fax from pt's pharmacy, [facility].  It states that they cannot fill the Ketoprofen 10% because they are unable to get it.  I have spoken with Britta MccreedyBarbara regarding this.  She will look over her formulary and call [facility] to see what they have or what they can get that is comparable to this medication.  [facility]

## 2017-09-22 ENCOUNTER — Encounter: Payer: BLUE CROSS/BLUE SHIELD | Admitting: Family Medicine

## 2017-09-26 ENCOUNTER — Other Ambulatory Visit: Payer: Self-pay | Admitting: Family Medicine

## 2017-10-06 ENCOUNTER — Telehealth: Payer: Self-pay | Admitting: *Deleted

## 2017-10-07 ENCOUNTER — Ambulatory Visit: Payer: BLUE CROSS/BLUE SHIELD | Admitting: Family Medicine

## 2017-10-07 ENCOUNTER — Encounter: Payer: Self-pay | Admitting: Family Medicine

## 2017-10-07 VITALS — BP 132/86 | Ht 64.0 in | Wt 242.6 lb

## 2017-10-07 DIAGNOSIS — R600 Localized edema: Secondary | ICD-10-CM | POA: Diagnosis not present

## 2017-10-07 DIAGNOSIS — Z79899 Other long term (current) drug therapy: Secondary | ICD-10-CM | POA: Diagnosis not present

## 2017-10-07 DIAGNOSIS — Z13220 Encounter for screening for lipoid disorders: Secondary | ICD-10-CM

## 2017-10-07 DIAGNOSIS — M170 Bilateral primary osteoarthritis of knee: Secondary | ICD-10-CM | POA: Diagnosis not present

## 2017-10-07 MED ORDER — MELOXICAM 15 MG PO TABS: 15.0000 mg | ORAL_TABLET | Freq: Every day | ORAL | 2 refills | Status: DC

## 2017-10-07 MED ORDER — FUROSEMIDE 20 MG PO TABS: ORAL_TABLET | ORAL | 2 refills | Status: DC

## 2017-10-07 MED ORDER — POTASSIUM CHLORIDE ER 10 MEQ PO TBCR: EXTENDED_RELEASE_TABLET | ORAL | 2 refills | Status: DC

## 2017-10-07 MED ORDER — PHENTERMINE HCL 37.5 MG PO TABS: 37.5000 mg | ORAL_TABLET | Freq: Every day | ORAL | 2 refills | Status: DC

## 2017-10-07 NOTE — Progress Notes (Signed)
   Subjective:    Patient ID: Sherri Fleming, female    DOB: 1975/11/27, 42 y.o.   MRN: [id number]  HPI Very nice patient comes in today with multiple different issues Patient arrives with bilateral knee pain-is unhappy with current orthopedic and would like referral to a new orthopedic.  She is frustrated by the fact that she has ongoing pain and discomfort she did not feel that the other orthopedist was giving enough focus on the issue.  She is tried anti-inflammatories without much success.  She did have an MRI which showed some degenerative changes.  She was diagnosed with osteoarthritis  Patient also having swelling in both her legs she relates lower leg edema some swelling puffiness this been going on for a couple months she denies any shortness of breath PND orthopnea.  She is unable to do a lot of physical activity later in the day because of the amount of pain and discomfort in her knees as well as swelling in her lower legs  Patient would also like to restart Adipex for weight loss-she is frustrated by her weight she does try to watch carbohydrates in her diet she does try to eat healthy she has had a hard time losing weight genetically this runs within the family   Review of Systems  Constitutional: Negative for activity change, appetite change and fatigue.  HENT: Negative for congestion and rhinorrhea.   Respiratory: Negative for cough and shortness of breath.   Cardiovascular: Positive for leg swelling. Negative for chest pain.  Gastrointestinal: Negative for abdominal pain and diarrhea.  Endocrine: Negative for polydipsia and polyphagia.  Musculoskeletal: Positive for arthralgias. Negative for back pain.  Skin: Negative for color change.  Neurological: Negative for dizziness and weakness.  Psychiatric/Behavioral: Negative for behavioral problems and confusion.       Objective:   Physical Exam  Constitutional: She appears well-nourished. No distress.  HENT:  Head:  Normocephalic and atraumatic.  Eyes: Right eye exhibits no discharge. Left eye exhibits no discharge.  Neck: No tracheal deviation present.  Cardiovascular: Normal rate, regular rhythm and normal heart sounds.  No murmur heard. Pulmonary/Chest: Effort normal and breath sounds normal. No respiratory distress.  Musculoskeletal: She exhibits no edema.  Lymphadenopathy:    She has no cervical adenopathy.  Neurological: She is alert. Coordination normal.  Skin: Skin is warm and dry.  Psychiatric: She has a normal mood and affect. Her behavior is normal.  Vitals reviewed.         Assessment & Plan:  Pedal edema-this is dependent edema partly related to the fact that she is unable to do a lot of activity because of severe osteoarthritis I recommend Lasix as needed 20 mg every morning she will let us know if it is too strong she will also take potassium along with it also we will check a metabolic 7  Has upcoming physical she will check a lipid profile  Morbid obesity patient is trying to watch diet we talked about dietary parameters also phenteramine 37.5 mg 1 every morning #30 with 2 refills  Side effects of medication were discussed stop medicine if side effects Risk of GI bleed and kidney dysfunction discussed with anti-inflammatory  Osteoarthritis stop Voltaren Try Mobic 15 mg 1 every morning [facility]  She does have a follow-up visit for a wellness checkup in September lab work before this

## 2017-10-13 ENCOUNTER — Encounter: Payer: Self-pay | Admitting: Family Medicine

## 2017-10-22 ENCOUNTER — Encounter: Payer: BLUE CROSS/BLUE SHIELD | Admitting: Family Medicine

## 2017-10-23 ENCOUNTER — Encounter: Payer: Self-pay | Admitting: Family Medicine

## 2017-10-23 ENCOUNTER — Other Ambulatory Visit (HOSPITAL_COMMUNITY)
Admission: RE | Admit: 2017-10-23 | Discharge: 2017-10-23 | Disposition: A | Discharge status: Home / Self Care | Payer: BLUE CROSS/BLUE SHIELD | Source: Ambulatory Visit | Attending: Family Medicine | Admitting: Family Medicine

## 2017-10-23 ENCOUNTER — Ambulatory Visit (INDEPENDENT_AMBULATORY_CARE_PROVIDER_SITE_OTHER): Payer: BLUE CROSS/BLUE SHIELD | Admitting: Family Medicine

## 2017-10-23 VITALS — BP 124/82 | Ht 64.0 in | Wt 239.0 lb

## 2017-10-23 DIAGNOSIS — J4520 Mild intermittent asthma, uncomplicated: Secondary | ICD-10-CM

## 2017-10-23 DIAGNOSIS — Z13220 Encounter for screening for lipoid disorders: Secondary | ICD-10-CM

## 2017-10-23 DIAGNOSIS — Z124 Encounter for screening for malignant neoplasm of cervix: Secondary | ICD-10-CM | POA: Diagnosis not present

## 2017-10-23 DIAGNOSIS — Z1329 Encounter for screening for other suspected endocrine disorder: Secondary | ICD-10-CM

## 2017-10-23 DIAGNOSIS — L709 Acne, unspecified: Secondary | ICD-10-CM

## 2017-10-23 DIAGNOSIS — R21 Rash and other nonspecific skin eruption: Secondary | ICD-10-CM | POA: Diagnosis not present

## 2017-10-23 DIAGNOSIS — Z23 Encounter for immunization: Secondary | ICD-10-CM

## 2017-10-23 DIAGNOSIS — R600 Localized edema: Secondary | ICD-10-CM

## 2017-10-23 DIAGNOSIS — I878 Other specified disorders of veins: Secondary | ICD-10-CM

## 2017-10-23 DIAGNOSIS — Z0000 Encounter for general adult medical examination without abnormal findings: Secondary | ICD-10-CM | POA: Insufficient documentation

## 2017-10-23 LAB — BASIC METABOLIC PANEL
ANION GAP: 6 (ref 5–15)
BUN: 14 mg/dL (ref 6–20)
CALCIUM: 8.4 mg/dL — AB (ref 8.9–10.3)
CO2: 24 mmol/L (ref 22–32)
Chloride: 107 mmol/L (ref 98–111)
Creatinine, Ser: 0.84 mg/dL (ref 0.44–1.00)
GFR calc Af Amer: >60 mL/min (ref >60)
GLUCOSE: 89 mg/dL (ref 70–99)
POTASSIUM: 3.9 mmol/L (ref 3.5–5.1)
Sodium: 137 mmol/L (ref 135–145)

## 2017-10-23 LAB — LIPID PANEL
Cholesterol: 144 mg/dL (ref 0–200)
HDL: 45 mg/dL (ref >40)
LDL Cholesterol: 92 mg/dL (ref 0–99)
TRIGLYCERIDES: 36 mg/dL (ref <150)
Total CHOL/HDL Ratio: 3.2 RATIO
VLDL: 7 mg/dL (ref 0–40)

## 2017-10-23 LAB — HEPATIC FUNCTION PANEL
ALT: 15 U/L (ref 0–44)
AST: 20 U/L (ref 15–41)
Albumin: 3.4 g/dL — ABNORMAL LOW (ref 3.5–5.0)
Alkaline Phosphatase: 47 U/L (ref 38–126)
BILIRUBIN DIRECT: 0.1 mg/dL (ref 0.0–0.2)
BILIRUBIN INDIRECT: 0.4 mg/dL (ref 0.3–0.9)
Total Bilirubin: 0.5 mg/dL (ref 0.3–1.2)
Total Protein: 7.2 g/dL (ref 6.5–8.1)

## 2017-10-23 LAB — TSH: TSH: 0.808 u[IU]/mL (ref 0.350–4.500)

## 2017-10-23 MED ORDER — ADAPALENE 0.1 % EX GEL: CUTANEOUS | 3 refills | Status: DC

## 2017-10-23 MED ORDER — TRIAMCINOLONE ACETONIDE 0.1 % EX CREA: 1.0000 "application " | TOPICAL_CREAM | Freq: Two times a day (BID) | CUTANEOUS | 3 refills | Status: DC

## 2017-10-23 NOTE — Progress Notes (Signed)
Subjective:    Patient ID: Sherri Fleming, female    DOB: 1976-01-04, 42 y.o.   MRN: [id number]  HPI The patient comes in today for a wellness visit.  Results for orders placed or performed in visit on 12/11/16  Strep A DNA probe  Result Value Ref Range   Strep Gp A Direct, DNA Probe Negative Negative  Specimen status report  Result Value Ref Range   specimen status report Comment   POCT rapid strep A  Result Value Ref Range   Rapid Strep A Screen Negative Negative     A review of their health history was completed.  A review of medications was also completed.  Any needed refills; adapalene; triamcinolone cream  Eating habits: have gotten better  Falls/  MVA accidents in past few months: no  Regular exercise: yes; has been exercising more  Specialist pt sees on regular basis: ortho   Preventative health issues were discussed.   Additional concerns: swelling in legs; small rash on back, wart on right ring finger.    Exercise  Feeling somewhat better, trying to walk more.  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  Took pictures.  Overall improved.  Has wart on finger.  Bothersome.  Notes arthritis overall has improved with Mobic.   Has chronic adult acne for which he uses at a point.  Manages well without noticeable side effect.  States definitely needs ongoing refills.  No longer sees a dermatologist for this.  Chronic allergies currently stable on current medication Review of Systems  Constitutional: Negative for activity change, appetite change and fatigue.  HENT: Negative for congestion and rhinorrhea.   Eyes: Negative for discharge.  Respiratory: Negative for cough, chest tightness and  wheezing.   Cardiovascular: Negative for chest pain.  Gastrointestinal: Negative for abdominal pain, blood in stool and vomiting.  Endocrine: Negative for polyphagia.  Genitourinary: Negative for difficulty urinating and frequency.  Musculoskeletal: Negative for neck pain.  Skin: Negative for color change.  Allergic/Immunologic: Negative for environmental allergies and food allergies.  Neurological: Negative for weakness and headaches.  Psychiatric/Behavioral: Negative for agitation and behavioral problems.  All other systems reviewed and are negative.      Objective:   Physical Exam  Constitutional: She is oriented to person, place, and time. She appears well-developed and well-nourished.  HENT:  Head: Normocephalic.  Right Ear: External ear normal.  Left Ear: External ear normal.  Eyes: Pupils are equal, round, and reactive to light.  Neck: Normal range of motion. No thyromegaly present.  Cardiovascular: Normal rate, regular rhythm, normal heart sounds and intact distal pulses.  No murmur heard. Pulmonary/Chest: Effort normal and breath sounds normal. No respiratory distress. She has no wheezes.  Abdominal: Soft. Bowel sounds are normal. She exhibits no distension and no mass. There is no tenderness.  Genitourinary: Vagina normal and uterus normal.  Genitourinary Comments: Pap smear obtained  Musculoskeletal: Normal range of motion. She exhibits no edema or tenderness.  Crepitation knees evident bilateral  Lymphadenopathy:    She has no cervical adenopathy.  Neurological: She is alert and oriented to person, place, and time. She exhibits normal muscle tone.  Skin:  Skin is warm and dry.  Skin reveals multiple findings.  Adult facial acne.  Overall good control.  Some postinflammatory hyperpigmentation noted.  Back reveals resolving nonspecific rash.  Finger reveals small wart.  Psychiatric: She has a normal mood and affect. Her behavior is normal.  Vitals reviewed.           Assessment & Plan:  Impression wellness exam.  And encouraged.  Diet exercise discussed.  Vaccines discussed.  Appropriate blood work discussed  2.  Multiple dermatological concerns.  Chronic adult facial acne.  Will refill Aveline.  Back rash is resolving no specific therapy at this time.  Finger reveals wart encourage of persistent on for liquid nitrogen treatment  3.  Arthritis.  Knees primarily.  Currently improving on Mobic.  4.  Peripheral edema very mild at this time.  Likely element of venous stasis.  Venous stasis discussed at length with patient  Flu shot today.  Appropriate blood work.  Chronic medications refilled in terms of dermatological concerns.  Diet exercise discussed.

## 2017-10-24 DIAGNOSIS — M25562 Pain in left knee: Secondary | ICD-10-CM | POA: Insufficient documentation

## 2017-10-24 DIAGNOSIS — M25561 Pain in right knee: Secondary | ICD-10-CM | POA: Insufficient documentation

## 2017-10-25 DIAGNOSIS — I878 Other specified disorders of veins: Secondary | ICD-10-CM | POA: Insufficient documentation

## 2017-10-27 ENCOUNTER — Other Ambulatory Visit (HOSPITAL_COMMUNITY)
Admission: RE | Admit: 2017-10-27 | Discharge: 2017-10-27 | Disposition: A | Discharge status: Home / Self Care | Payer: BLUE CROSS/BLUE SHIELD | Source: Ambulatory Visit | Attending: Orthopedic Surgery | Admitting: Orthopedic Surgery

## 2017-10-27 DIAGNOSIS — R69 Illness, unspecified: Secondary | ICD-10-CM | POA: Diagnosis not present

## 2017-10-27 LAB — SEDIMENTATION RATE: SED RATE: 21 mm/h (ref 0–22)

## 2017-10-27 LAB — CBC
HCT: 36.1 % (ref 36.0–46.0)
HEMOGLOBIN: 12 g/dL (ref 12.0–15.0)
MCH: 30 pg (ref 26.0–34.0)
MCHC: 33.2 g/dL (ref 30.0–36.0)
MCV: 90.3 fL (ref 78.0–100.0)
Platelets: 433 10*3/uL — ABNORMAL HIGH (ref 150–400)
RBC: 4 MIL/uL (ref 3.87–5.11)
RDW: 14.5 % (ref 11.5–15.5)
WBC: 5.8 10*3/uL (ref 4.0–10.5)

## 2017-10-27 LAB — C-REACTIVE PROTEIN: CRP: 2.8 mg/dL — ABNORMAL HIGH (ref <1.0)

## 2017-10-28 LAB — PAP IG W/ RFLX HPV ASCU: PAP Smear Comment: 0

## 2017-10-28 LAB — ANA: ANA: NEGATIVE

## 2017-10-28 LAB — RHEUMATOID FACTOR: Rhuematoid fact SerPl-aCnc: <10 IU/mL (ref 0.0–13.9)

## 2017-10-29 ENCOUNTER — Ambulatory Visit: Payer: Self-pay | Admitting: Orthopaedic Surgery

## 2017-11-01 ENCOUNTER — Encounter: Payer: Self-pay | Admitting: Family Medicine

## 2017-11-13 ENCOUNTER — Telehealth: Payer: Self-pay | Admitting: Family Medicine

## 2017-11-13 NOTE — Telephone Encounter (Signed)
Patient calling in regard of ortho ordering blood work but was instructed results would be with [place] to decide wether pt should go to a hemologist, advise and inform pt of further.

## 2017-11-16 NOTE — Telephone Encounter (Signed)
i'm sorry I do not understand this, nurse to call plz

## 2017-11-16 NOTE — Telephone Encounter (Signed)
Discussed with pt. She states Dr. Darrelyn Hillock ordered bw and states dr Brett Canales could review to see if she needs to see rheumatology. bw is in epic.

## 2017-11-18 ENCOUNTER — Other Ambulatory Visit: Payer: Self-pay | Admitting: Family Medicine

## 2017-11-18 NOTE — Telephone Encounter (Signed)
Left message to return call 

## 2017-11-18 NOTE — Telephone Encounter (Signed)
With normal sed rate and uric acid and rheum factor doubt that rheumatologist would add muchat this point

## 2017-11-18 NOTE — Telephone Encounter (Signed)
Discussed with pt. Pt verbalized understanding.  °

## 2017-12-23 ENCOUNTER — Telehealth: Payer: Self-pay | Admitting: Family Medicine

## 2017-12-23 ENCOUNTER — Other Ambulatory Visit: Payer: Self-pay

## 2017-12-23 ENCOUNTER — Other Ambulatory Visit: Payer: Self-pay | Admitting: Family Medicine

## 2017-12-23 MED ORDER — FLUOCINOLONE ACETONIDE SCALP 0.01 % EX OIL: TOPICAL_OIL | CUTANEOUS | 2 refills | Status: DC

## 2017-12-23 NOTE — Telephone Encounter (Signed)
Please advise. Thank you

## 2017-12-23 NOTE — Telephone Encounter (Signed)
Pt states she was unable to pick up medication due to instructions being left off prescription. Advise.

## 2017-12-23 NOTE — Telephone Encounter (Signed)
[facility] [place] Tried to contact patient but voicemail is full; unable to leave message.

## 2017-12-23 NOTE — Telephone Encounter (Signed)
fluocinolone 0.01 % oil for the scalp, pt would like medication refill for this medication, originally dermatology filled prescription, pt states at her last appt 10/23/2017 with Dr.Steve there was a discussion of this medication to have filled then but she could not think of the name at the time of the appt. Advise.

## 2017-12-23 NOTE — Telephone Encounter (Signed)
Ok plus two ref 

## 2017-12-23 NOTE — Telephone Encounter (Signed)
bid

## 2017-12-23 NOTE — Telephone Encounter (Signed)
Resent to [facility] [place].with changes to apply bid.Pt is aware.

## 2017-12-23 NOTE — Telephone Encounter (Signed)
Pt returned call. I made pt aware medication was sent to pharmacy.

## 2017-12-23 NOTE — Telephone Encounter (Signed)
Med on med list just had apply topically. Pharm sent a note and needed direction on how many times a day to apply.

## 2018-01-05 ENCOUNTER — Other Ambulatory Visit: Payer: Self-pay | Admitting: Family Medicine

## 2018-01-05 DIAGNOSIS — Z1231 Encounter for screening mammogram for malignant neoplasm of breast: Secondary | ICD-10-CM

## 2018-01-07 ENCOUNTER — Ambulatory Visit (HOSPITAL_COMMUNITY)
Admission: RE | Admit: 2018-01-07 | Discharge: 2018-01-07 | Disposition: A | Discharge status: Home / Self Care | Payer: BLUE CROSS/BLUE SHIELD | Source: Ambulatory Visit | Attending: Family Medicine | Admitting: Family Medicine

## 2018-01-07 DIAGNOSIS — Z1231 Encounter for screening mammogram for malignant neoplasm of breast: Secondary | ICD-10-CM | POA: Diagnosis not present

## 2018-02-08 ENCOUNTER — Other Ambulatory Visit: Payer: Self-pay | Admitting: Family Medicine

## 2018-03-25 ENCOUNTER — Ambulatory Visit: Payer: BLUE CROSS/BLUE SHIELD | Admitting: Family Medicine

## 2018-03-25 VITALS — BP 132/78 | Temp 98.0°F | Wt 231.2 lb

## 2018-03-25 DIAGNOSIS — B359 Dermatophytosis, unspecified: Secondary | ICD-10-CM | POA: Diagnosis not present

## 2018-03-25 DIAGNOSIS — L03115 Cellulitis of right lower limb: Secondary | ICD-10-CM

## 2018-03-25 MED ORDER — PHENTERMINE HCL 37.5 MG PO TABS: 37.5000 mg | ORAL_TABLET | Freq: Every day | ORAL | 1 refills | Status: DC

## 2018-03-25 MED ORDER — KETOCONAZOLE 2 % EX CREA: 1.0000 "application " | TOPICAL_CREAM | Freq: Two times a day (BID) | CUTANEOUS | 4 refills | Status: DC

## 2018-03-25 MED ORDER — CEPHALEXIN 500 MG PO CAPS: 500.0000 mg | ORAL_CAPSULE | Freq: Four times a day (QID) | ORAL | 0 refills | Status: DC

## 2018-03-25 NOTE — Progress Notes (Signed)
   Subjective:    Patient ID: Sherri Fleming, female    DOB: 03-27-1975, 43 y.o.   MRN: [id number]  Toe Pain   The incident occurred 3 to 5 days ago (noticed soreness on Saturday). The pain is present in the right toes. The quality of the pain is described as burning (burning while in shower; throbbing toe). Associated symptoms comments: Hard to walk.   Pt states she does work out and thought she may have had athletes foot. Pt states she did spray Lotrim spray on her foot and it became worse.  The patient's BMI is calculated.  The patient does have obesity.  The patient does try to some degree staying active and watching diet.  It is in the vital signs and acknowledged.  It is above the recommended BMI for the patient's height and weight.  The patient has been counseled regarding healthy diet, restricted portions, avoiding excessive carbohydrates/sugary foods, and increase physical activity as health permits.  It is in the patient's best interest to lower the risk of secondary illness including heart disease strokes and cancer by losing weight.  The patient acknowledges this information.   Review of Systems  Constitutional: Negative for activity change and appetite change.  HENT: Negative for congestion and rhinorrhea.   Respiratory: Negative for cough and shortness of breath.   Cardiovascular: Negative for chest pain and leg swelling.  Gastrointestinal: Negative for abdominal pain, nausea and vomiting.  Skin: Negative for color change.  Neurological: Negative for dizziness and weakness.  Psychiatric/Behavioral: Negative for agitation and confusion.       Objective:   Physical Exam Vitals signs reviewed.  Constitutional:      General: She is not in acute distress. HENT:     Head: Normocephalic and atraumatic.  Eyes:     General:        Right eye: No discharge.        Left eye: No discharge.  Neck:     Trachea: No tracheal deviation.  Cardiovascular:     Rate and Rhythm:  Normal rate and regular rhythm.     Heart sounds: Normal heart sounds. No murmur.  Pulmonary:     Effort: Pulmonary effort is normal. No respiratory distress.     Breath sounds: Normal breath sounds.  Lymphadenopathy:     Cervical: No cervical adenopathy.  Skin:    General: Skin is warm and dry.  Neurological:     Mental Status: She is alert.     Coordination: Coordination normal.  Psychiatric:        Behavior: Behavior normal.   Now with toe infection noted Tinea noted        Assessment & Plan:  Cellulitis with tinea recommend ketoconazole also recommend antibiotics over the next 7 days follow-up if progressive troubles or worse warning signs were discussed in detail  Healthy diet recommended Morbid obesity prescription for phentermine was given patient was told she could use it over the next couple months then she needs to take a break from it she is to continue healthy eating portion control as well as physical activity  Keep regular follow-up visits

## 2018-03-30 ENCOUNTER — Telehealth: Payer: Self-pay | Admitting: Family Medicine

## 2018-03-30 MED ORDER — FLUCONAZOLE 150 MG PO TABS: 150.0000 mg | ORAL_TABLET | Freq: Once | ORAL | 0 refills | Status: AC

## 2018-03-30 NOTE — Telephone Encounter (Signed)
May I send in Diflucan per protocol?

## 2018-03-30 NOTE — Telephone Encounter (Signed)
sure

## 2018-03-30 NOTE — Telephone Encounter (Signed)
I called pt and she is aware to pick up.

## 2018-03-30 NOTE — Telephone Encounter (Signed)
Pt was seen last week with Dr. Lorin Picket and since taking antibiotic she is having vaginal irritation. She is stating it is a mild itching, no discharge. She is hoping something can be called in for this.   Pharmacy [facility] [address] [place] - [address]

## 2018-04-04 DIAGNOSIS — L02619 Cutaneous abscess of unspecified foot: Secondary | ICD-10-CM | POA: Diagnosis not present

## 2018-04-04 DIAGNOSIS — L03119 Cellulitis of unspecified part of limb: Secondary | ICD-10-CM | POA: Diagnosis not present

## 2018-04-04 DIAGNOSIS — B353 Tinea pedis: Secondary | ICD-10-CM | POA: Diagnosis not present

## 2018-06-16 ENCOUNTER — Other Ambulatory Visit: Payer: Self-pay | Admitting: Family Medicine

## 2018-06-16 ENCOUNTER — Other Ambulatory Visit: Payer: Self-pay | Admitting: *Deleted

## 2018-06-16 ENCOUNTER — Telehealth: Payer: Self-pay | Admitting: Family Medicine

## 2018-06-16 NOTE — Telephone Encounter (Signed)
Seen Dr.Scott on 03/25/2018 for cellulitis. Please advise. Thank you

## 2018-06-16 NOTE — Telephone Encounter (Signed)
So please clarify with the patient is are more than 1 type of Nasonex? Otherwise go ahead and send in Nasonex mist 2 sprays each nostril daily with 5 refills

## 2018-06-16 NOTE — Telephone Encounter (Signed)
Left message to return call to clarify what pt is wanting. Only nasonex suspension is in epic and I called [facility] [place] and they only have  nasonex suspension and nasonex implant.

## 2018-06-16 NOTE — Telephone Encounter (Signed)
Patient needs new prescription for nasonex mist called into [facility].She states the regular nasonex make her throat sore the mist is better. Please advise

## 2018-06-21 NOTE — Telephone Encounter (Signed)
Patient states what she is needing is sensimist by flonase it is over the counter but she would like it written as a rx so she can use her HSA card. Please advise.

## 2018-06-22 NOTE — Telephone Encounter (Signed)
Rx sensimist by flonase, use as directed ref prn

## 2018-06-22 NOTE — Telephone Encounter (Signed)
Rx written and awaiting Dr.Signature

## 2018-06-24 NOTE — Telephone Encounter (Signed)
Left message to see if pt got rx. Not sure if it was faxed or if pt was notified

## 2018-11-10 ENCOUNTER — Ambulatory Visit (INDEPENDENT_AMBULATORY_CARE_PROVIDER_SITE_OTHER): Payer: PRIVATE HEALTH INSURANCE | Admitting: Family Medicine

## 2018-11-10 ENCOUNTER — Other Ambulatory Visit: Payer: Self-pay

## 2018-11-10 DIAGNOSIS — L709 Acne, unspecified: Secondary | ICD-10-CM

## 2018-11-10 DIAGNOSIS — R21 Rash and other nonspecific skin eruption: Secondary | ICD-10-CM

## 2018-11-10 MED ORDER — DOXYCYCLINE HYCLATE 100 MG PO TABS: ORAL_TABLET | ORAL | 0 refills | Status: DC

## 2018-11-10 MED ORDER — ADAPALENE 0.1 % EX GEL: CUTANEOUS | 3 refills | Status: DC

## 2018-11-10 MED ORDER — CLINDAMYCIN PHOSPHATE 1 % EX GEL: CUTANEOUS | 3 refills | Status: DC

## 2018-11-10 NOTE — Progress Notes (Signed)
   Subjective:  Audio plus video  Patient ID: Sherri Fleming, female    DOB: Jun 30, 1975, 43 y.o.   MRN: [id number]  HPI Pt is having an outbreak of acne on face from mask. Pt states she has some pus fill pustules that go from ear to ear. OTC Neutrogena; couple weeks but started getting worse in the past week.   Virtual Visit via Video Note  I connected with Renda Rolls on 11/10/18 at  3:00 PM EDT by a video enabled telemedicine application and verified that I am speaking with the correct person using two identifiers.  Location: Patient: home Provider: office   I discussed the limitations of evaluation and management by telemedicine and the availability of in person appointments. The patient expressed understanding and agreed to proceed.  History of Present Illness:    Observations/Objective:   Assessment and Plan:   Follow Up Instructions:    I discussed the assessment and treatment plan with the patient. The patient was provided an opportunity to ask questions and all were answered. The patient agreed with the plan and demonstrated an understanding of the instructions.   The patient was advised to call back or seek an in-person evaluation if the symptoms worsen or if the condition fails to improve as anticipated.  I provided 18 minutes of non-face-to-face time during this encounter.  Patient has a tendency towards control.  This is been significantly worsened by wearing a mask.  Develops the acne across the distribution of the mass.  At one time she is on prescription oral meds and prescription topical creams for this.  They definitely helps.  She like to get back on.  Everything she is trying over-the-counter is not working Vicente Males, LPN    Review of Systems No chest pain no headache no shortness of breath no cough    Objective:   Physical Exam   Virtual though distinctly you can see the eruption of acne along the mass distribution     Assessment &  Plan:  Impression adult onset acne with exacerbation mild mass.  Oral doxy given for 1 month.  Resumption of topical agents rationale discussed

## 2018-11-14 ENCOUNTER — Encounter: Payer: Self-pay | Admitting: Family Medicine

## 2019-01-10 ENCOUNTER — Telehealth: Payer: Self-pay | Admitting: Family Medicine

## 2019-01-10 MED ORDER — PHENTERMINE HCL 37.5 MG PO TABS: 37.5000 mg | ORAL_TABLET | Freq: Every day | ORAL | 2 refills | Status: DC

## 2019-01-10 NOTE — Telephone Encounter (Signed)
Adipex was prescribed in February and note says she can take it for a few months but then must take a break. Patient has not been on med recently and wants to restart med. Does she meed virtual visit

## 2019-01-10 NOTE — Telephone Encounter (Signed)
Patient is requesting refill on phentamine 37/5 called into [facility].

## 2019-01-10 NOTE — Telephone Encounter (Signed)
3 months worth okay

## 2019-02-01 ENCOUNTER — Other Ambulatory Visit: Payer: Self-pay

## 2019-02-01 ENCOUNTER — Ambulatory Visit: Payer: PRIVATE HEALTH INSURANCE | Attending: Internal Medicine

## 2019-02-01 ENCOUNTER — Ambulatory Visit (INDEPENDENT_AMBULATORY_CARE_PROVIDER_SITE_OTHER): Payer: PRIVATE HEALTH INSURANCE | Admitting: Family Medicine

## 2019-02-01 DIAGNOSIS — R062 Wheezing: Secondary | ICD-10-CM | POA: Diagnosis not present

## 2019-02-01 DIAGNOSIS — J019 Acute sinusitis, unspecified: Secondary | ICD-10-CM | POA: Diagnosis not present

## 2019-02-01 DIAGNOSIS — Z20828 Contact with and (suspected) exposure to other viral communicable diseases: Secondary | ICD-10-CM

## 2019-02-01 DIAGNOSIS — Z20822 Contact with and (suspected) exposure to covid-19: Secondary | ICD-10-CM

## 2019-02-01 MED ORDER — AZITHROMYCIN 250 MG PO TABS: ORAL_TABLET | ORAL | 0 refills | Status: DC

## 2019-02-01 MED ORDER — ALBUTEROL SULFATE HFA 108 (90 BASE) MCG/ACT IN AERS: 2.0000 | INHALATION_SPRAY | RESPIRATORY_TRACT | 1 refills | Status: DC | PRN

## 2019-02-01 NOTE — Progress Notes (Signed)
   Subjective:    Patient ID: Sherri Fleming, female    DOB: 01-18-1976, 43 y.o.   MRN: [id number]  HPI congestion started 8 days ago. Then 4 days ago started having runny nose, headache behind eyes. Wheezing started today.  Patient's had a week and a half of head congestion nasal pressure denies difficulty breathing denies fever chills sweats denies nausea vomiting diarrhea.  Does relate some wheezing and coughing today.  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  Location: Patient: home Provider: office   I discussed the limitations of evaluation and management by telemedicine and the availability of in person appointments. The patient expressed understanding and agreed to proceed.  History of Present Illness:    Observations/Objective:   Assessment and Plan:   Follow Up Instructions:    I discussed the assessment and treatment plan with the patient. The patient was provided an opportunity to ask questions and all were answered. The patient agreed with the plan and demonstrated an understanding of the instructions.   The patient was advised to call back or seek an in-person evaluation if the symptoms worsen or if the condition fails to improve as anticipated.  I provided 16 minutes of non-face-to-face time during this encounter.       Review of Systems  Constitutional: Negative for activity change and fever.  HENT: Positive for congestion and rhinorrhea. Negative for ear pain.   Eyes: Negative for discharge.  Respiratory: Positive for cough and wheezing. Negative for shortness of breath.   Cardiovascular: Negative for chest pain.       Objective:   Physical  Exam  Patient had virtual visit Appears to be in no distress Atraumatic Neuro able to relate and oriented No apparent resp distress Color normal       Assessment & Plan:  Probable Covid infection Acute rhinosinusitis Z-Pak as directed Albuterol as needed 2 puffs every 4 hours as needed Warning signs were discussed in detail Patient did do testing She gets a rapid test through her workplace but it was negative.  She had additional testing done at the testing center today Currently right now does not need to go to urgent care or ER Will monitor closely.  She will notify us if any problems.

## 2019-02-02 LAB — NOVEL CORONAVIRUS, NAA: SARS-CoV-2, NAA: DETECTED — AB

## 2019-02-03 ENCOUNTER — Telehealth: Payer: Self-pay | Admitting: *Deleted

## 2019-02-03 NOTE — Telephone Encounter (Signed)
Patient notified and stated she was aware of results-notified by [facility] and is aware of waring signs and quarantine protocol.

## 2019-02-03 NOTE — Telephone Encounter (Signed)
Covid results - positive

## 2019-02-03 NOTE — Telephone Encounter (Signed)
Ok reiterate precautions

## 2019-02-10 ENCOUNTER — Other Ambulatory Visit: Payer: Self-pay

## 2019-02-10 ENCOUNTER — Encounter: Payer: Self-pay | Admitting: Family Medicine

## 2019-02-10 ENCOUNTER — Other Ambulatory Visit: Payer: Self-pay | Admitting: *Deleted

## 2019-02-10 ENCOUNTER — Ambulatory Visit (HOSPITAL_COMMUNITY)
Admission: RE | Admit: 2019-02-10 | Discharge: 2019-02-10 | Disposition: A | Discharge status: Home / Self Care | Payer: PRIVATE HEALTH INSURANCE | Source: Ambulatory Visit | Attending: Family Medicine | Admitting: Family Medicine

## 2019-02-10 ENCOUNTER — Telehealth: Payer: Self-pay | Admitting: Family Medicine

## 2019-02-10 ENCOUNTER — Encounter (HOSPITAL_COMMUNITY): Payer: Self-pay

## 2019-02-10 DIAGNOSIS — R05 Cough: Secondary | ICD-10-CM | POA: Insufficient documentation

## 2019-02-10 DIAGNOSIS — R059 Cough, unspecified: Secondary | ICD-10-CM

## 2019-02-10 NOTE — Telephone Encounter (Signed)
Still having some congestion and sputum - clear sometimes yellow thick. Some cough but not a lot. No sob  but some wheezing but states nothing major. No fever. She does feel better.

## 2019-02-10 NOTE — Telephone Encounter (Signed)
Stat chest x-ray it would be best for the patient to try to go right away if we are going to get results before this weekend  Nurses please find out from patient any shortness of breath with this Does she have albuterol to use for the wheezing If not send in inhaler 2 puffs every 4 hours as needed wheezing Do chest x-ray ASAP

## 2019-02-10 NOTE — Telephone Encounter (Signed)
Discussed with pt and she states no sob and she does have an inhaler for wheezing and it does help. Order for stat cxr put in and pt states she will go in about one hour for xray.  Number to call [phone]

## 2019-02-10 NOTE — Telephone Encounter (Signed)
Pt would like to have chest xray ordered. She is coming out of quarantine and would like to make sure she doesn't have covid pneumonia before going back out in public.

## 2019-02-10 NOTE — Telephone Encounter (Signed)
Called to see if they went since no results and they were on the way at 5:15. Number below in message if you need to call after getting results.  

## 2019-02-11 NOTE — Telephone Encounter (Signed)
I did inform the patient and the patient's daughter of the results of their x-ray thank you

## 2019-02-14 ENCOUNTER — Encounter: Payer: Self-pay | Admitting: Family Medicine

## 2019-02-14 ENCOUNTER — Telehealth: Payer: Self-pay | Admitting: Family Medicine

## 2019-02-14 NOTE — Telephone Encounter (Signed)
Front Work excuse was dictated for this patient please send it/letter via MyChart Thank you

## 2019-02-22 ENCOUNTER — Telehealth: Payer: Self-pay | Admitting: Family Medicine

## 2019-02-22 MED ORDER — CEFDINIR 300 MG PO CAPS: 300.0000 mg | ORAL_CAPSULE | Freq: Two times a day (BID) | ORAL | 0 refills | Status: DC

## 2019-02-22 NOTE — Telephone Encounter (Signed)
Omnicef 300 bid ten d 

## 2019-02-22 NOTE — Telephone Encounter (Signed)
Prescription sent electronically to pharmacy. Patient notified. 

## 2019-02-22 NOTE — Telephone Encounter (Signed)
Pt would like to know if she needs a virtual appt for left ear pain causing drainage in throat or if something can be called in.

## 2019-02-22 NOTE — Telephone Encounter (Signed)
Throat is sore on one side and that is the ear that is bothering her- worse in am when she wakes up- some drainage- started after Covid infection-got better but z pack but started back 2 days ago [facility] uses [facility] in [place]

## 2019-03-07 ENCOUNTER — Encounter: Payer: Self-pay | Admitting: Family Medicine

## 2019-05-03 ENCOUNTER — Other Ambulatory Visit: Payer: Self-pay | Admitting: Family Medicine

## 2019-05-03 NOTE — Telephone Encounter (Signed)
Well sept 2019

## 2019-08-06 ENCOUNTER — Other Ambulatory Visit: Payer: Self-pay | Admitting: Family Medicine

## 2019-08-09 NOTE — Telephone Encounter (Signed)
Patient will need to discuss this mom with [name]

## 2019-08-16 NOTE — Telephone Encounter (Signed)
Has appt 7/15

## 2019-08-18 ENCOUNTER — Other Ambulatory Visit: Payer: Self-pay

## 2019-08-18 ENCOUNTER — Encounter: Payer: Self-pay | Admitting: Nurse Practitioner

## 2019-08-18 ENCOUNTER — Ambulatory Visit (INDEPENDENT_AMBULATORY_CARE_PROVIDER_SITE_OTHER): Payer: PRIVATE HEALTH INSURANCE | Admitting: Nurse Practitioner

## 2019-08-18 VITALS — BP 130/86 | Temp 96.9°F | Wt 236.2 lb

## 2019-08-18 DIAGNOSIS — Z01419 Encounter for gynecological examination (general) (routine) without abnormal findings: Secondary | ICD-10-CM | POA: Diagnosis not present

## 2019-08-18 DIAGNOSIS — Z1329 Encounter for screening for other suspected endocrine disorder: Secondary | ICD-10-CM | POA: Diagnosis not present

## 2019-08-18 DIAGNOSIS — Z13220 Encounter for screening for lipoid disorders: Secondary | ICD-10-CM

## 2019-08-18 DIAGNOSIS — B369 Superficial mycosis, unspecified: Secondary | ICD-10-CM

## 2019-08-18 DIAGNOSIS — Z1321 Encounter for screening for nutritional disorder: Secondary | ICD-10-CM | POA: Diagnosis not present

## 2019-08-18 DIAGNOSIS — Z1159 Encounter for screening for other viral diseases: Secondary | ICD-10-CM

## 2019-08-18 NOTE — Patient Instructions (Signed)
Medications that were discussed today: Qsymia  Saxenda  Oxempic

## 2019-08-18 NOTE — Progress Notes (Signed)
   Subjective:    Patient ID: Sherri Fleming, female    DOB: 04/09/1975, 44 y.o.   MRN: [id number]  HPI The patient comes in today for a wellness visit.    A review of their health history was completed.  A review of medications was also completed.  Any needed refills;  Phentermine; differin gel; triamcinolone cream; scalp oil   Eating habits:   Falls/  MVA accidents in past few months: none  Regular exercise: not as much as she should  Specialist pt sees on regular basis: none  Preventative health issues were discussed.   Additional concerns: swelling in both legs, toe fungus, weight loss, diet/exercise.    Review of Systems     Objective:   Physical Exam        Assessment & Plan:

## 2019-08-18 NOTE — Progress Notes (Addendum)
Subjective:    Patient ID: Sherri Fleming, female    DOB: Dec 05, 1975, 44 y.o.   MRN: [id number]  Presents for annual exam.    Single female lives with one child.  Regular menses, lasts 3-4 days, not heavy.  Has not been sexually active in >2 months.  When sexually active will use condoms.   Pt works 3rd shift.  Diet:  Tries to eat healthy, avoids fried/fatty foods.  Pt has concerns with not losing weight.  Reports she was on Phentermine for one month and didn't get a refill and now prescription has expired.  Wants to discuss weight loss options.   Exercise:  Pt walks 30-45 min 2 times per week.   Pt reports she has had rash (possible fungus)  in between right 3rd and 4th toes since last year.  Was given cream and rash did clear up and disappear but returned 4-5 months ago.  Pt wears ted hose and thinks moisture is causing rash to return.          Review of Systems  Constitutional: Negative for activity change, appetite change and fever.  HENT: Negative for ear discharge, ear pain, sinus pressure, sinus pain, sore throat and trouble swallowing.        Last dental visit first week in July 2021, goes to dentist twice a year.    Eyes:       Last eye doctor appointment was 08-2018, sees doctor every year.   Respiratory: Negative for cough, chest tightness, shortness of breath and wheezing.   Cardiovascular: Negative for chest pain, palpitations and leg swelling.  Gastrointestinal: Negative for abdominal pain, blood in stool, constipation, diarrhea and vomiting.  Genitourinary: Negative for difficulty urinating, dysuria, enuresis, frequency, genital sores, menstrual problem, pelvic pain, urgency, vaginal bleeding, vaginal discharge and vaginal pain.  Musculoskeletal:       Still has bilateral knee joint pain.  Denies swelling.   Possible fungus in between 3rd and 4th right toes.    Psychiatric/Behavioral: Negative for self-injury, sleep disturbance and suicidal ideas. The patient is  not nervous/anxious.    Depression screen [facility] 2/9 08/18/2019 10/23/2017 09/17/2016  Decreased Interest 0 0 0  Down, Depressed, Hopeless 0 0 0  PHQ - 2 Score 0 0 0  Altered sleeping - 0 -  Tired, decreased energy - 0 -  Change in appetite - 0 -  Feeling bad or failure about yourself  - 0 -  Trouble concentrating - 0 -  Moving slowly or fidgety/restless - 0 -  Suicidal thoughts - 0 -  PHQ-9 Score - 0 -  Difficult doing work/chores - Not difficult at all -        Objective:   Physical Exam Vitals reviewed.  Constitutional:      General: She is not in acute distress.    Appearance: She is obese.  HENT:     Right Ear: Tympanic membrane normal.     Left Ear: Tympanic membrane normal.     Nose: Nose normal.     Mouth/Throat:     Mouth: Mucous membranes are moist.  Eyes:     Conjunctiva/sclera: Conjunctivae normal.  Neck:     Comments: Thyroid non tender to palpation. No mass or goiter noted.  Cardiovascular:     Rate and Rhythm: Normal rate and regular rhythm.     Pulses: Normal pulses.     Heart sounds: Normal heart sounds. No murmur heard.  No gallop.   Pulmonary:  Effort: Pulmonary effort is normal. No respiratory distress.     Breath sounds: Normal breath sounds. No wheezing.  Chest:     Breasts:        Right: No swelling, inverted nipple, mass, skin change or tenderness.        Left: No swelling, inverted nipple, mass, skin change or tenderness.  Abdominal:     General: Bowel sounds are normal. There is no distension.     Palpations: There is no mass.     Tenderness: There is no abdominal tenderness.  Genitourinary:    General: Normal vulva.     Vagina: No vaginal discharge.     Comments: External GU with no rashes or lesions.  Vagina with pink mucosa, no lesions or discharge noted.  Cervix normal in appearance. No CMT. Bimanual exam: no tenderness or obvious masses; exam limited due to abdominal girth.  Musculoskeletal:     Cervical back: Normal range of  motion.  Lymphadenopathy:     Cervical: No cervical adenopathy.     Upper Body:     Right upper body: No supraclavicular, axillary or pectoral adenopathy.     Left upper body: No supraclavicular, axillary or pectoral adenopathy.  Skin:    General: Skin is warm and dry.     Comments: Small amount of white thick discharge between the third and fourth toes on the right foot. No erythema. Otherwise skin clear.   Neurological:     Mental Status: She is alert and oriented to person, place, and time.  Psychiatric:        Mood and Affect: Mood normal.        Behavior: Behavior normal.        Thought Content: Thought content normal.        Judgment: Judgment normal.     Today's Vitals   08/18/19 1104  BP: 130/86  Temp: (!) 96.9 F (36.1 C)  Weight: 107.1 kg   Body mass index is 40.54 kg/m.      Assessment & Plan:   Problem List Items Addressed This Visit      Other   Morbid obesity (HCC)    Other Visit Diagnoses    Well woman exam    -  Primary   Relevant Orders   Lipid Profile   Comprehensive Metabolic Panel (CMET)   CBC with Differential   TSH   Vitamin D (25 hydroxy)   Hepatitis C Antibody   Lipid screening       Relevant Orders   Lipid Profile   Comprehensive Metabolic Panel (CMET)   CBC with Differential   TSH   Vitamin D (25 hydroxy)   Hepatitis C Antibody   Screening for thyroid disorder       Relevant Orders   Lipid Profile   Comprehensive Metabolic Panel (CMET)   CBC with Differential   TSH   Vitamin D (25 hydroxy)   Hepatitis C Antibody   Encounter for vitamin deficiency screening       Relevant Orders   Lipid Profile   Comprehensive Metabolic Panel (CMET)   CBC with Differential   TSH   Vitamin D (25 hydroxy)   Hepatitis C Antibody   Encounter for hepatitis C screening test for low risk patient       Relevant Orders   Lipid Profile   Comprehensive Metabolic Panel (CMET)   CBC with Differential   TSH   Vitamin D (25 hydroxy)   Hepatitis C  Antibody   Fungal  infection of skin       Relevant Medications   ketoconazole (NIZORAL) 2 % cream     Meds ordered this encounter  Medications  . ketoconazole (NIZORAL) 2 % cream    Sig: Apply 1 application topically 2 (two) times daily. To affected areas between the toes prn    Dispense:  30 g    Refill:  0    Order Specific Question:   Supervising Provider    Answer:   Lilyan Punt A [[id number]]  . triamcinolone cream (KENALOG) 0.1 %    Sig: Apply 1 application topically 2 (two) times daily. Prn up to 14 days at a time    Dispense:  30 g    Refill:  0    Order Specific Question:   Supervising Provider    Answer:   Lilyan Punt A [[id number]]  . Fluocinolone Acetonide Scalp 0.01 % OIL    Sig: Apply qd to scalp prn up to 14 days at a time    Dispense:  118.28 mL    Refill:  0    Order Specific Question:   Supervising Provider    Answer:   Lilyan Punt A [[id number]]  . adapalene (DIFFERIN) 0.1 % gel    Sig: APPLY A LAYER TOPICALLY TO ACNE AT BEDTIME AS NEEDED    Dispense:  45 g    Refill:  0    Please consider 90 day supplies to promote better adherence    Order Specific Question:   Supervising Provider    Answer:   Lilyan Punt A [[id number]]      Discussed medications for weight loss with patient today including Qsymia, Saxenda, Oxempic.  Pt will let office know if she wants to pursue one of these medications.   Encouraged continued activity and healthy diet.  Labs pending.  Defers contraceptives at this time.  Return in about 1 year (around 08/17/2020) for physical.

## 2019-08-19 ENCOUNTER — Encounter: Payer: Self-pay | Admitting: Nurse Practitioner

## 2019-08-19 ENCOUNTER — Other Ambulatory Visit: Payer: Self-pay | Admitting: Nurse Practitioner

## 2019-08-19 MED ORDER — PHENTERMINE HCL 37.5 MG PO TABS: 37.5000 mg | ORAL_TABLET | Freq: Every day | ORAL | 2 refills | Status: DC

## 2019-08-20 ENCOUNTER — Other Ambulatory Visit: Payer: Self-pay | Admitting: Nurse Practitioner

## 2019-08-20 ENCOUNTER — Encounter: Payer: Self-pay | Admitting: Nurse Practitioner

## 2019-08-20 MED ORDER — ADAPALENE 0.1 % EX GEL: CUTANEOUS | 0 refills | Status: DC

## 2019-08-20 MED ORDER — KETOCONAZOLE 2 % EX CREA: 1.0000 "application " | TOPICAL_CREAM | Freq: Two times a day (BID) | CUTANEOUS | 0 refills | Status: DC

## 2019-08-20 MED ORDER — TRIAMCINOLONE ACETONIDE 0.1 % EX CREA: 1.0000 "application " | TOPICAL_CREAM | Freq: Two times a day (BID) | CUTANEOUS | 0 refills | Status: DC

## 2019-08-20 MED ORDER — FLUOCINOLONE ACETONIDE SCALP 0.01 % EX OIL: TOPICAL_OIL | CUTANEOUS | 0 refills | Status: DC

## 2019-11-15 ENCOUNTER — Telehealth: Payer: Self-pay

## 2019-11-15 NOTE — Telephone Encounter (Signed)
Left message to return call 

## 2019-11-15 NOTE — Telephone Encounter (Signed)
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

## 2019-11-15 NOTE — Telephone Encounter (Signed)
Patient got into some poison ivy and wanting something called into [facility]

## 2019-11-15 NOTE — Telephone Encounter (Signed)
So in this situation there are options #1-steroid cream applied twice daily as needed would help get it to gradually go away but it could take 10 to 14 days If patient would rather do steroid cream triamcinolone 0.1% apply twice daily as needed until healed 45 g with 2 refills  Option #2 prednisone over the course of the next 9 days should help get it to go away but can cause in some people nausea or in some individuals can make them feel bad If she is fine with prednisone I recommend prednisone 20 mg, #18,3qd for 3d then 2qd for 3d then 1qd for 3d If any ongoing troubles or problems recommend follow-up

## 2019-11-16 ENCOUNTER — Other Ambulatory Visit: Payer: Self-pay | Admitting: Nurse Practitioner

## 2019-11-16 MED ORDER — PREDNISONE 20 MG PO TABS
ORAL_TABLET | ORAL | 0 refills | Status: DC
Start: 2019-11-16 — End: 2020-08-29

## 2019-11-16 NOTE — Telephone Encounter (Signed)
Pt returned call. Pt states she will try the Prednisone 20 mg tablets. Prednisone sent to [facility]

## 2019-11-16 NOTE — Telephone Encounter (Signed)
Is the scalp getting better?  Did the fluuocinolone scalp oil help?   Dr. Ladona Ridgel

## 2020-02-22 ENCOUNTER — Other Ambulatory Visit: Payer: Self-pay | Admitting: Family Medicine

## 2020-02-22 NOTE — Telephone Encounter (Signed)
Pls forward to carolyn.  Haven't seen pt and last seen by her. Thx. Dr. Ladona Ridgel

## 2020-02-22 NOTE — Telephone Encounter (Signed)
Last seen July for wellness by carolyn.

## 2020-06-01 ENCOUNTER — Encounter: Payer: PRIVATE HEALTH INSURANCE | Admitting: Family Medicine

## 2020-06-04 ENCOUNTER — Encounter: Payer: Self-pay | Admitting: Family Medicine

## 2020-06-04 ENCOUNTER — Other Ambulatory Visit: Payer: Self-pay

## 2020-06-05 ENCOUNTER — Encounter: Payer: Self-pay | Admitting: Family Medicine

## 2020-06-05 ENCOUNTER — Ambulatory Visit (INDEPENDENT_AMBULATORY_CARE_PROVIDER_SITE_OTHER): Payer: PRIVATE HEALTH INSURANCE | Admitting: Family Medicine

## 2020-06-05 ENCOUNTER — Other Ambulatory Visit: Payer: Self-pay

## 2020-06-05 DIAGNOSIS — M25551 Pain in right hip: Secondary | ICD-10-CM | POA: Diagnosis not present

## 2020-06-05 DIAGNOSIS — S39012D Strain of muscle, fascia and tendon of lower back, subsequent encounter: Secondary | ICD-10-CM

## 2020-06-05 MED ORDER — IBUPROFEN 800 MG PO TABS: 800.0000 mg | ORAL_TABLET | Freq: Three times a day (TID) | ORAL | 0 refills | Status: AC | PRN
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
## 2020-06-06 MED ORDER — FLUOCINOLONE ACETONIDE SCALP 0.01 % EX OIL: TOPICAL_OIL | CUTANEOUS | 0 refills | Status: DC

## 2020-06-19 ENCOUNTER — Ambulatory Visit: Payer: PRIVATE HEALTH INSURANCE | Admitting: Family Medicine

## 2020-06-28 ENCOUNTER — Encounter: Payer: Self-pay | Admitting: Family Medicine

## 2020-06-28 ENCOUNTER — Ambulatory Visit: Payer: PRIVATE HEALTH INSURANCE | Admitting: Family Medicine

## 2020-08-16 ENCOUNTER — Ambulatory Visit: Payer: PRIVATE HEALTH INSURANCE | Admitting: Family Medicine

## 2020-08-29 ENCOUNTER — Ambulatory Visit (INDEPENDENT_AMBULATORY_CARE_PROVIDER_SITE_OTHER): Payer: PRIVATE HEALTH INSURANCE | Admitting: Family Medicine

## 2020-08-29 ENCOUNTER — Other Ambulatory Visit: Payer: Self-pay

## 2020-08-29 ENCOUNTER — Telehealth: Payer: Self-pay | Admitting: Family Medicine

## 2020-08-29 ENCOUNTER — Encounter: Payer: Self-pay | Admitting: Family Medicine

## 2020-08-29 VITALS — BP 135/87 | HR 76 | Temp 97.7°F | Ht 64.0 in | Wt 242.0 lb

## 2020-08-29 DIAGNOSIS — R030 Elevated blood-pressure reading, without diagnosis of hypertension: Secondary | ICD-10-CM

## 2020-08-29 DIAGNOSIS — Z0000 Encounter for general adult medical examination without abnormal findings: Secondary | ICD-10-CM

## 2020-08-29 DIAGNOSIS — Z0001 Encounter for general adult medical examination with abnormal findings: Secondary | ICD-10-CM | POA: Diagnosis not present

## 2020-08-29 DIAGNOSIS — Z1231 Encounter for screening mammogram for malignant neoplasm of breast: Secondary | ICD-10-CM

## 2020-08-29 MED ORDER — FLUOCINOLONE ACETONIDE SCALP 0.01 % EX OIL: TOPICAL_OIL | CUTANEOUS | 0 refills | Status: DC

## 2020-08-29 NOTE — Telephone Encounter (Signed)
Pls call pt to get labs prior to her well woman exam with carolyn, about 1 wk prior fasting.  Thx. Dr. Ladona Ridgel   Patient notified and verbalized understanding.
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
## 2020-10-05 ENCOUNTER — Ambulatory Visit (INDEPENDENT_AMBULATORY_CARE_PROVIDER_SITE_OTHER): Payer: 59 | Admitting: Nurse Practitioner

## 2020-10-05 ENCOUNTER — Encounter: Payer: Self-pay | Admitting: Nurse Practitioner

## 2020-10-05 ENCOUNTER — Other Ambulatory Visit (HOSPITAL_COMMUNITY): Payer: Self-pay | Admitting: Nurse Practitioner

## 2020-10-05 ENCOUNTER — Other Ambulatory Visit: Payer: Self-pay

## 2020-10-05 VITALS — BP 120/68 | HR 74 | Temp 97.2°F | Ht 64.0 in | Wt 247.0 lb

## 2020-10-05 DIAGNOSIS — Z1151 Encounter for screening for human papillomavirus (HPV): Secondary | ICD-10-CM | POA: Diagnosis not present

## 2020-10-05 DIAGNOSIS — Z1231 Encounter for screening mammogram for malignant neoplasm of breast: Secondary | ICD-10-CM

## 2020-10-05 DIAGNOSIS — Z1211 Encounter for screening for malignant neoplasm of colon: Secondary | ICD-10-CM

## 2020-10-05 DIAGNOSIS — Z0000 Encounter for general adult medical examination without abnormal findings: Secondary | ICD-10-CM | POA: Diagnosis not present

## 2020-10-05 DIAGNOSIS — Z124 Encounter for screening for malignant neoplasm of cervix: Secondary | ICD-10-CM | POA: Diagnosis not present

## 2020-10-05 MED ORDER — NORETHINDRONE 0.35 MG PO TABS: 1.0000 | ORAL_TABLET | Freq: Every day | ORAL | 3 refills | Status: DC

## 2020-10-05 NOTE — Progress Notes (Signed)
   Subjective:    Patient ID: Sherri Fleming, female    DOB: 24-Apr-1975, 45 y.o.   MRN: [id number]  HPI Patient presents for her yearly Pap smear.  Could not get this done during her physical due to her cycle.  Patient is interesting in discussing options to help her with perimenopause and her cycles.  Same sexual partner.  Using condoms for birth control.  Non-smoker.  Struggling with her weight.  Has tried phentermine with short-term success.  Is interested in looking at other options that may help her as far as medication.  Denies any personal history of pancreatitis.  No thyroid or endocrine cancers in her family.    Review of Systems No discharge rash lesions or pelvic pain.  Defers STD testing.    Objective:   Physical Exam NAD.  Alert, oriented.  Cheerful affect.  External GU no rashes or lesions.  Vagina pink and moist, no discharge noted.  Cervix normal limit in the areas.  Pap smear obtained.  No CMT.  Bimanual exam no tenderness or obvious masses, exam limited due to abdominal girth.      Assessment & Plan:   Problem List Items Addressed This Visit       Other   Morbid obesity (HCC)   Other Visit Diagnoses     Encounter for screening for cervical cancer    -  Primary   Relevant Orders   IGP, Aptima HPV   Screening for HPV (human papillomavirus)       Relevant Orders   IGP, Aptima HPV   Screen for colon cancer       Relevant Orders   Ambulatory referral to Gastroenterology      Meds ordered this encounter  Medications   norethindrone (ORTHO MICRONOR) 0.35 MG tablet    Sig: Take 1 tablet (0.35 mg total) by mouth daily.    Dispense:  84 tablet    Refill:  3    Order Specific Question:   Supervising Provider    Answer:   Lilyan Punt A [id number]   Discussed options regarding her cycle.  Recommend that she avoid estrogen as a precaution due to potential risk.  Discussed long-acting contraceptive such as Nexplanon and IUD.  Trial of Micronor daily as directed.   Call back if any heavy or prolonged bleeding.  Patient understands this may stop her cycle. Also referred for screening colonoscopy for her age. Mammogram scheduled for 9/14. Patient to explore options such as Bernie Covey and [facility] Lutes to see if insurance will cover and let us know. Return in about 1 year (around 10/05/2021) for physical.

## 2020-10-05 NOTE — Patient Instructions (Signed)
[place]    [place]

## 2020-10-06 ENCOUNTER — Encounter: Payer: Self-pay | Admitting: Nurse Practitioner

## 2020-10-06 LAB — CMP14+EGFR
ALT: 13 IU/L (ref 0–32)
AST: 14 IU/L (ref 0–40)
Albumin/Globulin Ratio: 1.2 (ref 1.2–2.2)
Albumin: 3.8 g/dL (ref 3.8–4.8)
Alkaline Phosphatase: 74 IU/L (ref 44–121)
BUN/Creatinine Ratio: 16 (ref 9–23)
BUN: 15 mg/dL (ref 6–24)
Bilirubin Total: 0.3 mg/dL (ref 0.0–1.2)
CO2: 24 mmol/L (ref 20–29)
Calcium: 9.4 mg/dL (ref 8.7–10.2)
Chloride: 100 mmol/L (ref 96–106)
Creatinine, Ser: 0.93 mg/dL (ref 0.57–1.00)
Globulin, Total: 3.3 g/dL (ref 1.5–4.5)
Glucose: 101 mg/dL — ABNORMAL HIGH (ref 65–99)
Potassium: 4.3 mmol/L (ref 3.5–5.2)
Sodium: 137 mmol/L (ref 134–144)
Total Protein: 7.1 g/dL (ref 6.0–8.5)
eGFR: 77 mL/min/{1.73_m2} (ref >59)

## 2020-10-06 LAB — LIPID PANEL
Chol/HDL Ratio: 3.4 ratio (ref 0.0–4.4)
Cholesterol, Total: 193 mg/dL (ref 100–199)
HDL: 56 mg/dL (ref >39)
LDL Chol Calc (NIH): 126 mg/dL — ABNORMAL HIGH (ref 0–99)
Triglycerides: 59 mg/dL (ref 0–149)
VLDL Cholesterol Cal: 11 mg/dL (ref 5–40)

## 2020-10-06 LAB — CBC
Hematocrit: 35.7 % (ref 34.0–46.6)
Hemoglobin: 11.8 g/dL (ref 11.1–15.9)
MCH: 30.3 pg (ref 26.6–33.0)
MCHC: 33.1 g/dL (ref 31.5–35.7)
MCV: 92 fL (ref 79–97)
Platelets: 499 10*3/uL — ABNORMAL HIGH (ref 150–450)
RBC: 3.89 x10E6/uL (ref 3.77–5.28)
RDW: 13.8 % (ref 11.7–15.4)
WBC: 6.1 10*3/uL (ref 3.4–10.8)

## 2020-10-06 LAB — TSH: TSH: 1.43 u[IU]/mL (ref 0.450–4.500)

## 2020-10-11 ENCOUNTER — Encounter: Payer: Self-pay | Admitting: Family Medicine

## 2020-10-11 DIAGNOSIS — D75839 Thrombocytosis, unspecified: Secondary | ICD-10-CM | POA: Insufficient documentation

## 2020-10-11 LAB — IGP, APTIMA HPV: HPV Aptima: NEGATIVE

## 2020-10-12 ENCOUNTER — Other Ambulatory Visit: Payer: Self-pay | Admitting: Family Medicine

## 2020-10-12 DIAGNOSIS — R7989 Other specified abnormal findings of blood chemistry: Secondary | ICD-10-CM

## 2020-10-17 ENCOUNTER — Ambulatory Visit (HOSPITAL_COMMUNITY): Payer: PRIVATE HEALTH INSURANCE

## 2020-10-26 ENCOUNTER — Ambulatory Visit: Payer: PRIVATE HEALTH INSURANCE | Admitting: Nurse Practitioner

## 2020-10-26 NOTE — Progress Notes (Signed)
Sherri Fleming, [place] [id number]   CLINIC:  Medical Oncology/Hematology  CONSULT NOTE  Patient Care Team: Erven Colla, DO as PCP - General (Family Medicine)  CHIEF COMPLAINTS/PURPOSE OF CONSULTATION:  Thrombocytosis  HISTORY OF PRESENTING ILLNESS:  Sherri Fleming 45 y.o. female is here at the request of her primary care provider, Dr. Elvia Collum, due to thrombocytosis.  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  Most recent CBC (10/05/2020 shows platelets 499, with otherwise unremarkable CBC.  NO history of DVT or PE. NO complaints of erythromelalgia, vasomotor, aquagenic pruritus NO B symptoms such as fever, chills, night sweats, unintentional weight loss. NO unusual bruising or bleeding, apart from occasional scant hemorrhoidal bleeding when wiping.  She reports that she has 2 to 3 days of heavy bleeding with each menstrual cycle.  She is a lifelong non-smoker.  She denies any history of inflammatory, autoimmune, or connective tissue disease.  No history of splenectomy.  Apart from obesity, she is otherwise very healthy.  Her only other past medical history is some hearing loss related to frequent childhood ear infections.  She is a lifelong non-smoker.  No alcohol consumption or illicit drug use.  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  No family history of cancer.  She does have a maternal grandmother with unspecified anemia and several cousins with iron deficiency anemia.   MEDICAL HISTORY:  Past Medical History:  Diagnosis Date   Acne    Asthma    Fractures    Hearing loss     SURGICAL HISTORY: Past Surgical History:  Procedure Laterality Date   HAMMER TOE SURGERY      SOCIAL HISTORY: Social History   Socioeconomic History   Marital status: Divorced    Spouse name: Not on file   Number of children: Not on file   Years of  education: Not on file   Highest education level: Not on file  Occupational History   Not on file  Tobacco Use   Smoking status: Never   Smokeless tobacco: Never  Substance and Sexual Activity   Alcohol use: No   Drug use: No   Sexual activity: Yes    Partners: Male  Other Topics Concern   Not on file  Social History Narrative   Not on file   Social Determinants of Health   Financial Resource Strain: Not on file  Food Insecurity: Not on file  Transportation Needs: Not on file  Physical Activity: Not on file  Stress: Not on file  Social Connections: Not on file  Intimate Partner Violence: Not on file    FAMILY HISTORY: Family History  Problem Relation Age of Onset   Diabetes Mother    Hypertension Mother    Diabetes Father    Diabetes Paternal Grandmother    Kidney disease Paternal Grandmother     ALLERGIES:  has No Known Allergies.  MEDICATIONS:  Current Outpatient Medications  Medication Sig Dispense Refill   Fluocinolone Acetonide Scalp 0.01 % OIL APPLY  OIL TOPICALLY TO SCALP DAILY  AS NEEDED FOR UP TO 14 DAYS AT A TIME 119 mL 0   ibuprofen (ADVIL) 800 MG tablet Take 1 tablet (800 mg total) by mouth every 8 (eight) hours as needed. 30 tablet 0   norethindrone (ORTHO MICRONOR) 0.35 MG tablet Take 1 tablet (0.35 mg total) by mouth daily. 84 tablet 3  triamcinolone cream (KENALOG) 0.1 % APPLY TOPICALLY TWICE DAILY AS NEEDED UP TO 14 DAYS AT A TIME 30 g 0   No current facility-administered medications for this visit.    REVIEW OF SYSTEMS:   Review of Systems  Constitutional:  Negative for appetite change, chills, diaphoresis, fatigue, fever and unexpected weight change.  HENT:   Negative for lump/mass and nosebleeds.   Eyes:  Negative for eye problems.  Respiratory:  Negative for cough, hemoptysis and shortness of breath.   Cardiovascular:  Negative for chest pain, leg swelling and palpitations.  Gastrointestinal:  Negative for abdominal pain, blood in stool,  constipation, diarrhea, nausea and vomiting.  Genitourinary:  Negative for hematuria.   Musculoskeletal:  Positive for arthralgias (oseoarthritis and knee pain) and myalgias (cramping in fingers and hands).  Skin: Negative.   Neurological:  Negative for dizziness, headaches and light-headedness.  Hematological:  Does not bruise/bleed easily.     PHYSICAL EXAMINATION: ECOG PERFORMANCE STATUS: 0 - Asymptomatic  There were no vitals filed for this visit. There were no vitals filed for this visit.  Physical Exam Constitutional:      Appearance: Normal appearance. She is obese.  HENT:     Head: Normocephalic and atraumatic.     Mouth/Throat:     Mouth: Mucous membranes are moist.  Eyes:     Extraocular Movements: Extraocular movements intact.     Pupils: Pupils are equal, round, and reactive to light.  Cardiovascular:     Rate and Rhythm: Normal rate and regular rhythm.     Pulses: Normal pulses.     Heart sounds: Normal heart sounds.  Pulmonary:     Effort: Pulmonary effort is normal.     Breath sounds: Normal breath sounds.  Abdominal:     General: Bowel sounds are normal.     Palpations: Abdomen is soft.     Tenderness: There is no abdominal tenderness.  Musculoskeletal:        General: No swelling.     Right lower leg: No edema.     Left lower leg: No edema.  Lymphadenopathy:     Cervical: No cervical adenopathy.  Skin:    General: Skin is warm and dry.  Neurological:     General: No focal deficit present.     Mental Status: She is alert and oriented to person, place, and time.  Psychiatric:        Mood and Affect: Mood normal.        Behavior: Behavior normal.      LABORATORY DATA:  I have reviewed the data as listed Recent Results (from the past 2160 hour(s))  IGP, Aptima HPV     Status: None   Collection Time: 10/05/20  9:47 AM  Result Value Ref Range   Interpretation NILM     Comment: NEGATIVE FOR INTRAEPITHELIAL LESION OR MALIGNANCY.   Category NIL      Comment: Negative for Intraepithelial Lesion   Adequacy SECNI     Comment: Satisfactory for evaluation. No endocervical component is identified.   Clinician Provided ICD10 Comment     Comment: Z12.4 Z11.51    Performed by: Comment     Comment: Evert Kohl, Cytotechnologist (ASCP)   Note: Comment     Comment: The Pap smear is a screening test designed to aid in the detection of premalignant and malignant conditions of the uterine cervix.  It is not a diagnostic procedure and should not be used as the sole means of detecting cervical cancer.  Both false-positive and false-negative reports do occur.    Test Methodology Comment     Comment: This liquid based ThinPrep(R) pap test was screened with the use of an image guided system.    HPV Aptima Negative Negative    Comment: This nucleic acid amplification test detects fourteen high-risk HPV types (16,18,31,33,35,39,45,51,52,56,58,59,66,68) without differentiation.   CBC     Status: Abnormal   Collection Time: 10/05/20 10:02 AM  Result Value Ref Range   WBC 6.1 3.4 - 10.8 x10E3/uL   RBC 3.89 3.77 - 5.28 x10E6/uL   Hemoglobin 11.8 11.1 - 15.9 g/dL   Hematocrit 35.7 34.0 - 46.6 %   MCV 92 79 - 97 fL   MCH 30.3 26.6 - 33.0 pg   MCHC 33.1 31.5 - 35.7 g/dL   RDW 13.8 11.7 - 15.4 %   Platelets 499 (H) 150 - 450 x10E3/uL  CMP14+EGFR     Status: Abnormal   Collection Time: 10/05/20 10:02 AM  Result Value Ref Range   Glucose 101 (H) 65 - 99 mg/dL   BUN 15 6 - 24 mg/dL   Creatinine, Ser 0.93 0.57 - 1.00 mg/dL   eGFR 77 >59 mL/min/1.73   BUN/Creatinine Ratio 16 9 - 23   Sodium 137 134 - 144 mmol/L   Potassium 4.3 3.5 - 5.2 mmol/L   Chloride 100 96 - 106 mmol/L   CO2 24 20 - 29 mmol/L   Calcium 9.4 8.7 - 10.2 mg/dL   Total Protein 7.1 6.0 - 8.5 g/dL   Albumin 3.8 3.8 - 4.8 g/dL   Globulin, Total 3.3 1.5 - 4.5 g/dL   Albumin/Globulin Ratio 1.2 1.2 - 2.2   Bilirubin Total 0.3 0.0 - 1.2 mg/dL   Alkaline Phosphatase 74 44 - 121 IU/L    AST 14 0 - 40 IU/L   ALT 13 0 - 32 IU/L  Lipid panel     Status: Abnormal   Collection Time: 10/05/20 10:02 AM  Result Value Ref Range   Cholesterol, Total 193 100 - 199 mg/dL   Triglycerides 59 0 - 149 mg/dL   HDL 56 >39 mg/dL   VLDL Cholesterol Cal 11 5 - 40 mg/dL   LDL Chol Calc (NIH) 126 (H) 0 - 99 mg/dL   Chol/HDL Ratio 3.4 0.0 - 4.4 ratio    Comment:                                   T. Chol/HDL Ratio                                             Men  Women                               1/2 Avg.Risk  3.4    3.3                                   Avg.Risk  5.0    4.4                                2X Avg.Risk  9.6  7.1                                3X Avg.Risk 23.4   11.0   TSH     Status: None   Collection Time: 10/05/20 10:02 AM  Result Value Ref Range   TSH 1.430 0.450 - 4.500 uIU/mL    RADIOGRAPHIC STUDIES: I have personally reviewed the radiological images as listed and agreed with the findings in the report. No results found.  ASSESSMENT & PLAN: 1.  Thrombocytosis, mild - Patient seen for initial consultation on 10/29/2020 request of her primary care provider, Dr. Elvia Collum, due to thrombocytosis. - Mild thrombocytosis since at least 2009, with platelets ranging from 400-500. - Most recent CBC (10/05/2020 shows platelets 499, with otherwise unremarkable CBC. -She denies any history of blood clot such as DVT or PE - No erythromelalgia, vasomotor symptoms, aquagenic pruritus.  No B symptoms.  No easy bruising or bleeding. - She is a non-smoker .  She denies any history of inflammatory, autoimmune, or connective tissue disease .  She has no history of splenectomy. - PLAN: Work-up of thrombocytosis including repeat CBC, iron panel, B12/MMA, ESR, CRP, ANA, RF, BCR/ABL FISH, and JAK2 with reflex.  If this work-up is negative, we would consider that patient's mild thrombocytosis may be reactive in the setting of morbid obesity.  RTC in 3 to 4 weeks to discuss results and  next steps.  2.  Other history - PMH: Obesity, hearing loss from frequent childhood ear infections - SOCIAL: Lifelong non-smoker.  No alcohol or illicit drug use.  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 - FAMILY: Maternal grandmother with unspecified anemia.  Several cousins with iron deficiency anemia.  No close relatives with cancer.   PLAN SUMMARY & DISPOSITION: - Labs today - RTC in 3 to 4 weeks to discuss results and next steps  All questions were answered. The patient knows to call the clinic with any problems, questions or concerns.   Medical decision making: Low  Time spent on visit: I spent 25 minutes counseling the patient face to face. The total time spent in the appointment was 40 minutes and more than 50% was on counseling.   I, Tarri Abernethy PA-C, have seen this patient in conjunction with Dr. Derek Jack. Greater than 50% of visit was performed by Dr. Delton Coombes.   Harriett Rush, PA-C 10/29/2020 9:54 AM  DR. Sakari Raisanen: I have independently done a face-to-face evaluation with this patient.  I agree with HPI written by Casey Burkitt, PA-C.  She has thrombocytosis ranging between 400-500 K since 2009.  She does not have any known chronic inflammatory states.  Discussed with her differential diagnosis for thrombocytosis.  She does not have history of thrombosis.  No vasomotor symptoms or aquagenic pruritus.  We will check for iron and ESR and CRP.  We will also check for myeloproliferative disorders including JAK2 and BCR/ABL.

## 2020-10-29 ENCOUNTER — Other Ambulatory Visit: Payer: Self-pay

## 2020-10-29 ENCOUNTER — Encounter (HOSPITAL_COMMUNITY): Payer: Self-pay | Admitting: Hematology

## 2020-10-29 ENCOUNTER — Inpatient Hospital Stay (HOSPITAL_COMMUNITY): Payer: PRIVATE HEALTH INSURANCE | Attending: Hematology | Admitting: Hematology

## 2020-10-29 ENCOUNTER — Inpatient Hospital Stay (HOSPITAL_COMMUNITY): Payer: PRIVATE HEALTH INSURANCE

## 2020-10-29 VITALS — BP 136/71 | HR 82 | Temp 96.7°F | Resp 18 | Ht 64.0 in | Wt 247.8 lb

## 2020-10-29 DIAGNOSIS — D75839 Thrombocytosis, unspecified: Secondary | ICD-10-CM

## 2020-10-29 DIAGNOSIS — H9190 Unspecified hearing loss, unspecified ear: Secondary | ICD-10-CM | POA: Diagnosis not present

## 2020-10-29 DIAGNOSIS — E669 Obesity, unspecified: Secondary | ICD-10-CM

## 2020-10-29 LAB — CBC WITH DIFFERENTIAL/PLATELET
Abs Immature Granulocytes: 0.01 10*3/uL (ref 0.00–0.07)
Basophils Absolute: 0 10*3/uL (ref 0.0–0.1)
Basophils Relative: 1 %
Eosinophils Absolute: 0.1 10*3/uL (ref 0.0–0.5)
Eosinophils Relative: 1 %
HCT: 36.9 % (ref 36.0–46.0)
Hemoglobin: 12.4 g/dL (ref 12.0–15.0)
Immature Granulocytes: 0 %
Lymphocytes Relative: 29 %
Lymphs Abs: 1.5 10*3/uL (ref 0.7–4.0)
MCH: 31.4 pg (ref 26.0–34.0)
MCHC: 33.6 g/dL (ref 30.0–36.0)
MCV: 93.4 fL (ref 80.0–100.0)
Monocytes Absolute: 0.3 10*3/uL (ref 0.1–1.0)
Monocytes Relative: 6 %
Neutro Abs: 3.2 10*3/uL (ref 1.7–7.7)
Neutrophils Relative %: 63 %
Platelets: 481 10*3/uL — ABNORMAL HIGH (ref 150–400)
RBC: 3.95 MIL/uL (ref 3.87–5.11)
RDW: 14.4 % (ref 11.5–15.5)
WBC: 5.1 10*3/uL (ref 4.0–10.5)
nRBC: 0 % (ref 0.0–0.2)

## 2020-10-29 LAB — IRON AND TIBC
Iron: 53 ug/dL (ref 28–170)
Saturation Ratios: 14 % (ref 10.4–31.8)
TIBC: 369 ug/dL (ref 250–450)
UIBC: 316 ug/dL

## 2020-10-29 LAB — FERRITIN: Ferritin: 28 ng/mL (ref 11–307)

## 2020-10-29 LAB — VITAMIN B12: Vitamin B-12: 787 pg/mL (ref 180–914)

## 2020-10-29 LAB — C-REACTIVE PROTEIN: CRP: 3.8 mg/dL — ABNORMAL HIGH (ref <1.0)

## 2020-10-29 LAB — SEDIMENTATION RATE: Sed Rate: 50 mm/hr — ABNORMAL HIGH (ref 0–22)

## 2020-10-29 NOTE — Patient Instructions (Signed)
[facility] at [facility] Discharge Instructions  You were seen today by Rojelio Brenner PA-C for your elevated platelets.  As we discussed, there are many potential causes of elevated platelets, including reaction to inflammation, iron deficiency, B12 deficiency, and in some rare cases certain types of blood disorders and cancer.  We will check several blood tests on you today to help determine the cause of your elevated platelets, and we will see you back in 3 to 4 weeks to discuss results.  LABS: Check labs today before leaving the hospital building.  MEDICATIONS: No changes to home medications  FOLLOW-UP APPOINTMENT: Office visit in 3 to 4 weeks to discuss results and next steps   Thank you for choosing [facility] at [facility] to provide your oncology and hematology care.  To afford each patient quality time with our provider, please arrive at least 15 minutes before your scheduled appointment time.   If you have a lab appointment with the Cancer Center please come in thru the Main Entrance and check in at the main information desk.  You need to re-schedule your appointment should you arrive 10 or more minutes late.  We strive to give you quality time with our providers, and arriving late affects you and other patients whose appointments are after yours.  Also, if you no show three or more times for appointments you may be dismissed from the clinic at the providers discretion.     Again, thank you for choosing [facility].  Our hope is that these requests will decrease the amount of time that you wait before being seen by our physicians.       _____________________________________________________________  Should you have questions after your visit to [facility], please contact our office at [phone] and follow the prompts.  Our office hours are 8:00 a.m. and 4:30 p.m. Monday - Friday.  Please note that  voicemails left after 4:00 p.m. may not be returned until the following business day.  We are closed weekends and major holidays.  You do have access to a nurse 24-7, just call the main number to the clinic [phone] and do not press any options, hold on the line and a nurse will answer the phone.    For prescription refill requests, have your pharmacy contact our office and allow 72 hours.    Due to Covid, you will need to wear a mask upon entering the hospital. If you do not have a mask, a mask will be given to you at the Main Entrance upon arrival. For doctor visits, patients may have 1 support person age 48 or older with them. For treatment visits, patients can not have anyone with them due to social distancing guidelines and our immunocompromised population.

## 2020-10-30 LAB — PATHOLOGIST SMEAR REVIEW

## 2020-10-30 LAB — RHEUMATOID FACTOR: Rheumatoid fact SerPl-aCnc: <10 IU/mL (ref <14.0)

## 2020-10-31 LAB — ANTINUCLEAR ANTIBODIES, IFA: ANA Ab, IFA: NEGATIVE

## 2020-11-01 LAB — BCR-ABL1 FISH
Cells Analyzed: 200
Cells Counted: 200

## 2020-11-03 LAB — METHYLMALONIC ACID, SERUM: Methylmalonic Acid, Quantitative: 135 nmol/L (ref 0–378)

## 2020-11-07 ENCOUNTER — Other Ambulatory Visit (HOSPITAL_COMMUNITY): Payer: Self-pay | Admitting: Nurse Practitioner

## 2020-11-07 DIAGNOSIS — Z1231 Encounter for screening mammogram for malignant neoplasm of breast: Secondary | ICD-10-CM

## 2020-11-09 LAB — CALR + JAK2 E12-15 + MPL (REFLEXED)

## 2020-11-09 LAB — JAK2 V617F, W REFLEX TO CALR/E12/MPL

## 2020-11-21 ENCOUNTER — Inpatient Hospital Stay (HOSPITAL_COMMUNITY): Admission: RE | Admit: 2020-11-21 | Payer: PRIVATE HEALTH INSURANCE | Source: Ambulatory Visit

## 2020-11-21 DIAGNOSIS — Z1231 Encounter for screening mammogram for malignant neoplasm of breast: Secondary | ICD-10-CM

## 2020-11-25 NOTE — Progress Notes (Signed)
Virtual Visit via Telephone Note [facility]  I connected with Sherri Fleming  on 11/26/20  at  1:17 PM  by telephone and verified that I am speaking with the correct person using two identifiers.  Location: Patient: Home Provider: [facility]   I discussed the limitations, risks, security and privacy concerns of performing an evaluation and management service by telephone and the availability of in person appointments. I also discussed with the patient that there may be a patient responsible charge related to this service. The patient expressed understanding and agreed to proceed.   HISTORY OF PRESENT ILLNESS: Ms. Sherri Fleming follows at our clinic for thrombocytosis.  She was seen for initial consultation by Dr. Delton Coombes and Tarri Abernethy PA-C on 10/29/2020.  At today's visit, she reports feeling fairly well and at baseline.  She has not had any changes in her health status since her last visit.  She has no complaints at today's visit.  She continues to deny any signs or symptoms of DVT/PE; no erythromelalgia, vasomotor symptoms, or aquagenic pruritus.  She has not noted any B symptoms such as fever, chills, night sweats, unintentional weight loss.  No unusual bruising or bleeding.  She has had occasional scant hemorrhoidal bleeding in the past, but has been increasing her dietary fiber and noted a marked improvement in her bowel movements.  She reports that her energy is at baseline and her appetite is 100%.    OBSERVATIONS/OBJECTIVE: Review of Systems  Constitutional:  Negative for chills, diaphoresis, fever, malaise/fatigue and weight loss.  Respiratory:  Negative for cough and shortness of breath.   Cardiovascular:  Positive for palpitations (occasional). Negative for chest pain.  Gastrointestinal:  Negative for abdominal pain, blood in stool, melena, nausea and vomiting.  Neurological:  Negative for dizziness and headaches.     PHYSICAL EXAM (per limitations of virtual telephone visit): The patient is alert and oriented x 3, exhibiting adequate mentation, good mood, and ability to speak in full sentences and execute sound judgement.   ASSESSMENT & PLAN: 1.  Thrombocytosis, mild - Patient seen for initial consultation on 10/29/2020 request of her primary care provider, Dr. Elvia Collum, due to thrombocytosis. - Mild thrombocytosis since at least 2009, with platelets ranging from 400-500. -She denies any history of blood clot such as DVT or PE - No erythromelalgia, vasomotor symptoms, aquagenic pruritus.  No B symptoms.  No easy bruising or bleeding. - She is a non-smoker.  She denies any history of inflammatory, autoimmune, or connective tissue disease .  She has no history of splenectomy. - MPN work-up was negative (negative BCR/ABL FISH, negative JAK2/CALR/MPL) - Other work-up (10/29/2020) showed ferritin 28 with iron saturation 14%.  ESR elevated at 50, CRP elevated at 3.8.  ANA and RF negative. - Pathologist smear review (10/29/2020) confirmed mild thrombocytosis - Most recent CBC (10/29/2020): Platelets 481, more or less at patient's baseline - PLAN: Work-up of thrombocytosis revealed mild iron deficiency, which may be a driving factor in patient's thrombocytosis, along with suspected reactive thrombocytosis in the setting of morbid obesity.  MPN work-up negative.  We will replete iron as below and recheck CBC in 6 months.  2.  Iron deficiency state, without anemia - Work-up of thrombocytosis (10/29/2020) revealed ferritin 28 with iron saturation 14% - Patient denies any abnormal blood loss - She reports 2 to 3 days of heavy bleeding with each menstrual cycle. - She is not currently taking any iron supplement  -  PLAN: Start patient on oral ferrous sulfate 325 mg once daily.  Repeat CBC and iron panel in 6 months.   3.  Other history - PMH: Obesity, hearing loss from frequent childhood ear infections - SOCIAL:  Lifelong non-smoker.  No alcohol or illicit drug use.  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 - FAMILY: Maternal grandmother with unspecified anemia.  Several cousins with iron deficiency anemia.  No close relatives with cancer.    FOLLOW UP INSTRUCTIONS: - Start taking daily iron supplement - Repeat labs and RTC in 6 months    I discussed the assessment and treatment plan with the patient. The patient was provided an opportunity to ask questions and all were answered. The patient agreed with the plan and demonstrated an understanding of the instructions.   The patient was advised to call back or seek an in-person evaluation if the symptoms worsen or if the condition fails to improve as anticipated.  I provided 12 minutes of non-face-to-face time during this encounter.   Sherri Rush, PA-C 11/26/2020 1:29 PM

## 2020-11-26 ENCOUNTER — Ambulatory Visit (HOSPITAL_BASED_OUTPATIENT_CLINIC_OR_DEPARTMENT_OTHER): Payer: PRIVATE HEALTH INSURANCE | Admitting: Physician Assistant

## 2020-11-26 ENCOUNTER — Encounter (HOSPITAL_COMMUNITY): Payer: Self-pay

## 2020-11-26 DIAGNOSIS — E611 Iron deficiency: Secondary | ICD-10-CM

## 2020-11-26 DIAGNOSIS — D75839 Thrombocytosis, unspecified: Secondary | ICD-10-CM

## 2021-03-29 ENCOUNTER — Encounter: Payer: Self-pay | Admitting: Family Medicine

## 2021-05-27 ENCOUNTER — Inpatient Hospital Stay (HOSPITAL_COMMUNITY): Payer: PRIVATE HEALTH INSURANCE | Attending: Physician Assistant

## 2021-05-27 ENCOUNTER — Other Ambulatory Visit (HOSPITAL_COMMUNITY): Payer: PRIVATE HEALTH INSURANCE

## 2021-06-03 ENCOUNTER — Ambulatory Visit (HOSPITAL_COMMUNITY): Payer: PRIVATE HEALTH INSURANCE | Admitting: Physician Assistant

## 2021-06-04 ENCOUNTER — Ambulatory Visit (HOSPITAL_COMMUNITY): Payer: PRIVATE HEALTH INSURANCE | Admitting: Physician Assistant

## 2021-09-03 ENCOUNTER — Encounter: Payer: Self-pay | Admitting: Nurse Practitioner

## 2021-09-03 ENCOUNTER — Ambulatory Visit (INDEPENDENT_AMBULATORY_CARE_PROVIDER_SITE_OTHER): Payer: PRIVATE HEALTH INSURANCE | Admitting: Nurse Practitioner

## 2021-09-03 VITALS — BP 118/78 | HR 76 | Temp 97.2°F | Ht 64.0 in | Wt 241.6 lb

## 2021-09-03 DIAGNOSIS — Z13220 Encounter for screening for lipoid disorders: Secondary | ICD-10-CM

## 2021-09-03 DIAGNOSIS — R7989 Other specified abnormal findings of blood chemistry: Secondary | ICD-10-CM

## 2021-09-03 DIAGNOSIS — Z0000 Encounter for general adult medical examination without abnormal findings: Secondary | ICD-10-CM

## 2021-09-03 DIAGNOSIS — Z1159 Encounter for screening for other viral diseases: Secondary | ICD-10-CM

## 2021-09-03 DIAGNOSIS — Z1211 Encounter for screening for malignant neoplasm of colon: Secondary | ICD-10-CM

## 2021-09-03 NOTE — Progress Notes (Signed)
Subjective:    Patient ID: Sherri Fleming, female    DOB: [birth year], 46 y.o.   MRN: [id number]  HPI  The patient comes in today for a wellness visit.    A review of their health history was completed.  A review of medications was also completed.  Any needed refills; none  Eating habits: Good  Falls/  MVA accidents in past few months: None  Regular exercise: Yes, but not consistent  Specialist pt sees on regular basis: none  Preventative health issues were discussed.   Additional concerns: none today   Review of Systems  All other systems reviewed and are negative.      Objective:   Physical Exam Vitals reviewed.  Constitutional:      General: She is not in acute distress.    Appearance: Normal appearance. She is normal weight. She is not ill-appearing, toxic-appearing or diaphoretic.  HENT:     Head: Normocephalic and atraumatic.  Cardiovascular:     Rate and Rhythm: Normal rate and regular rhythm.     Pulses: Normal pulses.     Heart sounds: Normal heart sounds. No murmur heard. Pulmonary:     Effort: Pulmonary effort is normal. No respiratory distress.     Breath sounds: Normal breath sounds. No wheezing.  Abdominal:     General: Abdomen is flat. Bowel sounds are normal. There is no distension.     Palpations: Abdomen is soft. There is no mass.     Tenderness: There is no abdominal tenderness. There is no right CVA tenderness, left CVA tenderness, guarding or rebound.     Hernia: No hernia is present.  Musculoskeletal:     Comments: Grossly intact  Skin:    General: Skin is warm.     Capillary Refill: Capillary refill takes less than 2 seconds.  Neurological:     Mental Status: She is alert.     Comments: Grossly intact  Psychiatric:        Mood and Affect: Mood normal.        Behavior: Behavior normal.           Assessment & Plan:   1. Well adult exam Adult wellness-complete.wellness physical was conducted today. Importance of diet  and exercise were discussed in detail.  Importance of stress reduction and healthy living were discussed.  In addition to this a discussion regarding safety was also covered.  We also reviewed over immunizations and gave recommendations regarding current immunization needed for age.   In addition to this additional areas were also touched on including: Preventative health exams needed:  Cologuard ordered today Hep C today Pap up to date Exam exam up to date Dental Exam up to date  Patient was advised yearly wellness exam   2. Morbid obesity ([place]) - Lipid Profile - HgB A1c - CMP14+EGFR  3. Elevated platelet count -Patient elevated platelet count and was seen by hematology.  Patient states that nothing was found to explain the elevated platelet count except for a little bit of inflammation. - We will recheck CBC today to determine if platelet counts are elevated - CBC with Differential  4. Lipid screening - Lipid Profile  5. Encounter for hepatitis C screening test for low risk patient - Hepatitis C Antibody  6. Screen for colon cancer - Cologuard     Note:  This document was prepared using Dragon voice recognition software and may include unintentional dictation errors. Note - This record has been created using [employer].  Chart creation errors have been sought, but may not always  have been located. Such creation errors do not reflect on  the standard of medical care.

## 2021-09-03 NOTE — Progress Notes (Signed)
Establish care/ PHYSICAL

## 2021-09-04 ENCOUNTER — Other Ambulatory Visit: Payer: Self-pay | Admitting: Nurse Practitioner

## 2021-09-04 DIAGNOSIS — R7989 Other specified abnormal findings of blood chemistry: Secondary | ICD-10-CM

## 2021-09-04 LAB — CBC WITH DIFFERENTIAL/PLATELET
Basophils Absolute: 0 10*3/uL (ref 0.0–0.2)
Basos: 1 %
EOS (ABSOLUTE): 0.2 10*3/uL (ref 0.0–0.4)
Eos: 4 %
Hematocrit: 38.5 % (ref 34.0–46.6)
Hemoglobin: 12.8 g/dL (ref 11.1–15.9)
Immature Grans (Abs): 0 10*3/uL (ref 0.0–0.1)
Immature Granulocytes: 0 %
Lymphocytes Absolute: 1.7 10*3/uL (ref 0.7–3.1)
Lymphs: 36 %
MCH: 29.6 pg (ref 26.6–33.0)
MCHC: 33.2 g/dL (ref 31.5–35.7)
MCV: 89 fL (ref 79–97)
Monocytes Absolute: 0.3 10*3/uL (ref 0.1–0.9)
Monocytes: 6 %
Neutrophils Absolute: 2.5 10*3/uL (ref 1.4–7.0)
Neutrophils: 53 %
Platelets: 473 10*3/uL — ABNORMAL HIGH (ref 150–450)
RBC: 4.33 x10E6/uL (ref 3.77–5.28)
RDW: 14.1 % (ref 11.7–15.4)
WBC: 4.8 10*3/uL (ref 3.4–10.8)

## 2021-09-04 LAB — CMP14+EGFR
ALT: 10 IU/L (ref 0–32)
AST: 13 IU/L (ref 0–40)
Albumin/Globulin Ratio: 1.4 (ref 1.2–2.2)
Albumin: 4.2 g/dL (ref 3.9–4.9)
Alkaline Phosphatase: 79 IU/L (ref 44–121)
BUN/Creatinine Ratio: 13 (ref 9–23)
BUN: 13 mg/dL (ref 6–24)
Bilirubin Total: 0.4 mg/dL (ref 0.0–1.2)
CO2: 22 mmol/L (ref 20–29)
Calcium: 9.6 mg/dL (ref 8.7–10.2)
Chloride: 103 mmol/L (ref 96–106)
Creatinine, Ser: 1.02 mg/dL — ABNORMAL HIGH (ref 0.57–1.00)
Globulin, Total: 3.1 g/dL (ref 1.5–4.5)
Glucose: 114 mg/dL — ABNORMAL HIGH (ref 70–99)
Potassium: 4.2 mmol/L (ref 3.5–5.2)
Sodium: 139 mmol/L (ref 134–144)
Total Protein: 7.3 g/dL (ref 6.0–8.5)
eGFR: 68 mL/min/{1.73_m2} (ref >59)

## 2021-09-04 LAB — HEPATITIS C ANTIBODY: Hep C Virus Ab: NONREACTIVE

## 2021-09-04 LAB — LIPID PANEL
Chol/HDL Ratio: 3.9 ratio (ref 0.0–4.4)
Cholesterol, Total: 201 mg/dL — ABNORMAL HIGH (ref 100–199)
HDL: 51 mg/dL (ref >39)
LDL Chol Calc (NIH): 137 mg/dL — ABNORMAL HIGH (ref 0–99)
Triglycerides: 69 mg/dL (ref 0–149)
VLDL Cholesterol Cal: 13 mg/dL (ref 5–40)

## 2021-09-04 LAB — HEMOGLOBIN A1C
Est. average glucose Bld gHb Est-mCnc: 131 mg/dL
Hgb A1c MFr Bld: 6.2 % — ABNORMAL HIGH (ref 4.8–5.6)

## 2022-04-23 ENCOUNTER — Ambulatory Visit (INDEPENDENT_AMBULATORY_CARE_PROVIDER_SITE_OTHER): Payer: 59 | Admitting: Family Medicine

## 2022-04-23 DIAGNOSIS — J4520 Mild intermittent asthma, uncomplicated: Secondary | ICD-10-CM | POA: Diagnosis not present

## 2022-04-23 DIAGNOSIS — B353 Tinea pedis: Secondary | ICD-10-CM

## 2022-04-23 DIAGNOSIS — E785 Hyperlipidemia, unspecified: Secondary | ICD-10-CM | POA: Insufficient documentation

## 2022-04-23 DIAGNOSIS — R7303 Prediabetes: Secondary | ICD-10-CM | POA: Insufficient documentation

## 2022-04-23 DIAGNOSIS — R21 Rash and other nonspecific skin eruption: Secondary | ICD-10-CM | POA: Insufficient documentation

## 2022-04-23 DIAGNOSIS — B369 Superficial mycosis, unspecified: Secondary | ICD-10-CM | POA: Insufficient documentation

## 2022-04-23 MED ORDER — CLOTRIMAZOLE 1 % EX CREA: 1.0000 | TOPICAL_CREAM | Freq: Two times a day (BID) | CUTANEOUS | 0 refills | Status: DC

## 2022-04-23 MED ORDER — TRIAMCINOLONE ACETONIDE 0.1 % EX CREA: TOPICAL_CREAM | CUTANEOUS | 0 refills | Status: DC

## 2022-04-23 MED ORDER — FLUOCINOLONE ACETONIDE SCALP 0.01 % EX OIL: TOPICAL_OIL | CUTANEOUS | 0 refills | Status: DC

## 2022-04-23 MED ORDER — MONTELUKAST SODIUM 10 MG PO TABS: 10.0000 mg | ORAL_TABLET | Freq: Every day | ORAL | 3 refills | Status: DC

## 2022-04-23 MED ORDER — ALBUTEROL SULFATE HFA 108 (90 BASE) MCG/ACT IN AERS
1.0000 | INHALATION_SPRAY | Freq: Four times a day (QID) | RESPIRATORY_TRACT | 0 refills | Status: DC | PRN
Start: 2022-04-23 — End: 2023-03-20

## 2022-04-23 MED ORDER — DESONIDE 0.05 % EX CREA: TOPICAL_CREAM | Freq: Every day | CUTANEOUS | 0 refills | Status: DC | PRN

## 2022-04-23 NOTE — Assessment & Plan Note (Signed)
Patient's topical steroids refilled today.

## 2022-04-23 NOTE — Progress Notes (Signed)
Subjective:  Patient ID: Sherri Fleming, female    DOB: [birth year]  Age: 47 y.o. MRN: [id number]  CC: Medication refill, toe concern   HPI:  Patient has a history of asthma.  Needs albuterol refill.  Patient also needs refills on topical steroids which she uses for her skin and scalp.  Additionally, patient reports that she has an area of concern to the right fifth toe.  She is concerned about the possibility of cellulitis.  No significant pain.  No fever.  Patient Active Problem List   Diagnosis Date Noted   Prediabetes 04/23/2022   Hyperlipidemia 04/23/2022   Tinea pedis 04/23/2022   Rash 04/23/2022   Thrombocytosis 10/11/2020   Venous stasis 10/25/2017   Hearing loss 08/27/2014   Asthma 07/07/2013   Morbid obesity ([place]) 07/07/2013    Social Hx   Social History   Socioeconomic History   Marital status: Divorced    Spouse name: Not on file   Number of children: Not on file   Years of education: Not on file   Highest education level: Not on file  Occupational History   Not on file  Tobacco Use   Smoking status: Never   Smokeless tobacco: Never  Vaping Use   Vaping Use: Never used  Substance and Sexual Activity   Alcohol use: No   Drug use: No   Sexual activity: Yes    Partners: Male  Other Topics Concern   Not on file  Social History Narrative   Not on file   Social Determinants of Health   Financial Resource Strain: Low Risk  (10/29/2020)   Overall Financial Resource Strain (CARDIA)    Difficulty of Paying Living Expenses: Not hard at all  Food Insecurity: No Food Insecurity (10/29/2020)   Hunger Vital Sign    Worried About Running Out of Food in the Last Year: Never true    Browning in the Last Year: Never true  Transportation Needs: No Transportation Needs (10/29/2020)   PRAPARE - Hydrologist (Medical): No    Lack of Transportation (Non-Medical): No  Physical Activity: Insufficiently Active (10/29/2020)    Exercise Vital Sign    Days of Exercise per Week: 3 days    Minutes of Exercise per Session: 30 min  Stress: No Stress Concern Present (10/29/2020)   [place]    Feeling of Stress : Not at all  Social Connections: Moderately Integrated (10/29/2020)   Social Connection and Isolation Panel [NHANES]    Frequency of Communication with Friends and Family: More than three times a week    Frequency of Social Gatherings with Friends and Family: More than three times a week    Attends Religious Services: More than 4 times per year    Active Member of [employer] or Organizations: Yes    Attends Archivist Meetings: 1 to 4 times per year    Marital Status: Divorced    Review of Systems Per HPI  Objective:  BP 138/78   Pulse 85   Temp 97.9 F (36.6 C)   Ht 5\' 4"  (1.626 m)   Wt 243 lb (110.2 kg)   SpO2 98%   BMI 41.71 kg/m      04/23/2022   11:27 AM 09/03/2021    9:39 AM 09/03/2021    9:18 AM  BP/Weight  Systolic BP 0000000 [id number]  Diastolic BP 78 78 92  Wt. (Lbs) 243  241.6  BMI 41.71 kg/m2  41.47 kg/m2    Physical Exam Vitals and nursing note reviewed.  Constitutional:      Appearance: Normal appearance. She is obese.  HENT:     Head: Normocephalic and atraumatic.  Cardiovascular:     Rate and Rhythm: Normal rate and regular rhythm.  Pulmonary:     Effort: Pulmonary effort is normal.     Breath sounds: Normal breath sounds.  Musculoskeletal:     Comments: Right foot -interdigital space between the fourth and fifth toes with maceration consistent with tinea pedis.  Neurological:     Mental Status: She is alert.  Psychiatric:        Mood and Affect: Mood normal.        Behavior: Behavior normal.     Lab Results  Component Value Date   WBC 4.8 09/03/2021   HGB 12.8 09/03/2021   HCT 38.5 09/03/2021   PLT 473 (H) 09/03/2021   GLUCOSE 114 (H) 09/03/2021   CHOL 201 (H) 09/03/2021   TRIG 69 09/03/2021    HDL 51 09/03/2021   LDLCALC 137 (H) 09/03/2021   ALT 10 09/03/2021   AST 13 09/03/2021   NA 139 09/03/2021   K 4.2 09/03/2021   CL 103 09/03/2021   CREATININE 1.02 (H) 09/03/2021   BUN 13 09/03/2021   CO2 22 09/03/2021   TSH 1.430 10/05/2020   HGBA1C 6.2 (H) 09/03/2021     Assessment & Plan:   Problem List Items Addressed This Visit       Respiratory   Asthma    Stable.  Albuterol refilled.  Singulair also refilled.      Relevant Medications   montelukast (SINGULAIR) 10 MG tablet   albuterol (VENTOLIN HFA) 108 (90 Base) MCG/ACT inhaler     Musculoskeletal and Integument   Rash    Patient's topical steroids refilled today.      Tinea pedis    Topical clotrimazole.      Relevant Medications   clotrimazole (CLOTRIMAZOLE AF) 1 % cream    Meds ordered this encounter  Medications   Fluocinolone Acetonide Scalp 0.01 % OIL    Sig: APPLY  OIL TOPICALLY TO SCALP DAILY  AS NEEDED FOR UP TO 14 DAYS AT A TIME    Dispense:  119 mL    Refill:  0   montelukast (SINGULAIR) 10 MG tablet    Sig: Take 1 tablet (10 mg total) by mouth at bedtime.    Dispense:  90 tablet    Refill:  3   albuterol (VENTOLIN HFA) 108 (90 Base) MCG/ACT inhaler    Sig: Inhale 1-2 puffs into the lungs every 6 (six) hours as needed for wheezing or shortness of breath.    Dispense:  18 g    Refill:  0   triamcinolone cream (KENALOG) 0.1 %    Sig: APPLY TOPICALLY TWICE DAILY AS NEEDED UP TO 14 DAYS AT A TIME    Dispense:  30 g    Refill:  0   desonide (DESOWEN) 0.05 % cream    Sig: Apply topically daily as needed (Face.).    Dispense:  30 g    Refill:  0   clotrimazole (CLOTRIMAZOLE AF) 1 % cream    Sig: Apply 1 Application topically 2 (two) times daily.    Dispense:  60 g    Refill:  0    Follow-up:  Return in about 1 year (around 04/23/2023).  [place]

## 2022-04-23 NOTE — Assessment & Plan Note (Addendum)
Stable.  Albuterol refilled.  Singulair also refilled.

## 2022-04-23 NOTE — Patient Instructions (Signed)
Medications as prescribed.  Follow up annually.  Take care  Dr. Lacinda Axon

## 2022-04-23 NOTE — Assessment & Plan Note (Signed)
Topical clotrimazole 

## 2022-05-13 ENCOUNTER — Telehealth: Payer: 59 | Admitting: Physician Assistant

## 2022-05-13 DIAGNOSIS — B9689 Other specified bacterial agents as the cause of diseases classified elsewhere: Secondary | ICD-10-CM

## 2022-05-13 DIAGNOSIS — J019 Acute sinusitis, unspecified: Secondary | ICD-10-CM

## 2022-05-13 MED ORDER — DOXYCYCLINE HYCLATE 100 MG PO TABS: 100.0000 mg | ORAL_TABLET | Freq: Two times a day (BID) | ORAL | 0 refills | Status: DC

## 2022-05-13 MED ORDER — TRIAMCINOLONE ACETONIDE 55 MCG/ACT NA AERO: 2.0000 | INHALATION_SPRAY | Freq: Every day | NASAL | 1 refills | Status: DC

## 2022-05-13 MED ORDER — TRIAMCINOLONE ACETONIDE 55 MCG/ACT NA AERO: 2.0000 | INHALATION_SPRAY | Freq: Every day | NASAL | 1 refills | Status: AC

## 2022-05-13 NOTE — Patient Instructions (Signed)
Sherri Fleming, thank you for joining Piedad Climes, PA-C for today's virtual visit.  While this provider is not your primary care provider (PCP), if your PCP is located in our provider database this encounter information will be shared with them immediately following your visit.   A [facility] MyChart account gives you access to today's visit and all your visits, tests, and labs performed at [facility] " click here if you don't have a  MyChart account or go to [email or web address]  Consent: (Patient) Sherri Fleming provided verbal consent for this virtual visit at the beginning of the encounter.  Current Medications:  Current Outpatient Medications:    albuterol (VENTOLIN HFA) 108 (90 Base) MCG/ACT inhaler, Inhale 1-2 puffs into the lungs every 6 (six) hours as needed for wheezing or shortness of breath., Disp: 18 g, Rfl: 0   clotrimazole (CLOTRIMAZOLE AF) 1 % cream, Apply 1 Application topically 2 (two) times daily., Disp: 60 g, Rfl: 0   desonide (DESOWEN) 0.05 % cream, Apply topically daily as needed (Face.)., Disp: 30 g, Rfl: 0   Fluocinolone Acetonide Scalp 0.01 % OIL, APPLY  OIL TOPICALLY TO SCALP DAILY  AS NEEDED FOR UP TO 14 DAYS AT A TIME, Disp: 119 mL, Rfl: 0   ibuprofen (ADVIL) 800 MG tablet, Take 1 tablet (800 mg total) by mouth every 8 (eight) hours as needed., Disp: 30 tablet, Rfl: 0   montelukast (SINGULAIR) 10 MG tablet, Take 1 tablet (10 mg total) by mouth at bedtime., Disp: 90 tablet, Rfl: 3   Multiple Vitamins-Minerals (CENTRUM SILVER 50+WOMEN PO), Take by mouth., Disp: , Rfl:    triamcinolone cream (KENALOG) 0.1 %, APPLY TOPICALLY TWICE DAILY AS NEEDED UP TO 14 DAYS AT A TIME, Disp: 30 g, Rfl: 0   Turmeric (QC TUMERIC COMPLEX PO), Take by mouth., Disp: , Rfl:    Medications ordered in this encounter:  No orders of the defined types were placed in this encounter.    *If you need refills on other medications prior to your next  appointment, please contact your pharmacy*  Follow-Up: Call back or seek an in-person evaluation if the symptoms worsen or if the condition fails to improve as anticipated.  [facility] [phone]  Other Instructions Please take antibiotic as directed.  Increase fluid intake.  Use Saline nasal spray.  Take a daily multivitamin. Continue your regular allergy medications. Try the Nasacort as directed.  Place a humidifier in the bedroom.  Please call or return clinic if symptoms are not improving.  Sinusitis Sinusitis is redness, soreness, and swelling (inflammation) of the paranasal sinuses. Paranasal sinuses are air pockets within the bones of your face (beneath the eyes, the middle of the forehead, or above the eyes). In healthy paranasal sinuses, mucus is able to drain out, and air is able to circulate through them by way of your nose. However, when your paranasal sinuses are inflamed, mucus and air can become trapped. This can allow bacteria and other germs to grow and cause infection. Sinusitis can develop quickly and last only a short time (acute) or continue over a long period (chronic). Sinusitis that lasts for more than 12 weeks is considered chronic.  CAUSES  Causes of sinusitis include: Allergies. Structural abnormalities, such as displacement of the cartilage that separates your nostrils (deviated septum), which can decrease the air flow through your nose and sinuses and affect sinus drainage. Functional abnormalities, such as when the small hairs (cilia) that line your sinuses  and help remove mucus do not work properly or are not present. SYMPTOMS  Symptoms of acute and chronic sinusitis are the same. The primary symptoms are pain and pressure around the affected sinuses. Other symptoms include: Upper toothache. Earache. Headache. Bad breath. Decreased sense of smell and taste. A cough, which worsens when you are lying flat. Fatigue. Fever. Thick drainage from  your nose, which often is green and may contain pus (purulent). Swelling and warmth over the affected sinuses. DIAGNOSIS  Your caregiver will perform a physical exam. During the exam, your caregiver may: Look in your nose for signs of abnormal growths in your nostrils (nasal polyps). Tap over the affected sinus to check for signs of infection. View the inside of your sinuses (endoscopy) with a special imaging device with a light attached (endoscope), which is inserted into your sinuses. If your caregiver suspects that you have chronic sinusitis, one or more of the following tests may be recommended: Allergy tests. Nasal culture A sample of mucus is taken from your nose and sent to a lab and screened for bacteria. Nasal cytology A sample of mucus is taken from your nose and examined by your caregiver to determine if your sinusitis is related to an allergy. TREATMENT  Most cases of acute sinusitis are related to a viral infection and will resolve on their own within 10 days. Sometimes medicines are prescribed to help relieve symptoms (pain medicine, decongestants, nasal steroid sprays, or saline sprays).  However, for sinusitis related to a bacterial infection, your caregiver will prescribe antibiotic medicines. These are medicines that will help kill the bacteria causing the infection.  Rarely, sinusitis is caused by a fungal infection. In theses cases, your caregiver will prescribe antifungal medicine. For some cases of chronic sinusitis, surgery is needed. Generally, these are cases in which sinusitis recurs more than 3 times per year, despite other treatments. HOME CARE INSTRUCTIONS  Drink plenty of water. Water helps thin the mucus so your sinuses can drain more easily. Use a humidifier. Inhale steam 3 to 4 times a day (for example, sit in the bathroom with the shower running). Apply a warm, moist washcloth to your face 3 to 4 times a day, or as directed by your caregiver. Use saline nasal  sprays to help moisten and clean your sinuses. Take over-the-counter or prescription medicines for pain, discomfort, or fever only as directed by your caregiver. SEEK IMMEDIATE MEDICAL CARE IF: You have increasing pain or severe headaches. You have nausea, vomiting, or drowsiness. You have swelling around your face. You have vision problems. You have a stiff neck. You have difficulty breathing. MAKE SURE YOU:  Understand these instructions. Will watch your condition. Will get help right away if you are not doing well or get worse. Document Released: 01/20/2005 Document Revised: 04/14/2011 Document Reviewed: 02/04/2011 [facility] Patient Information 2014 [place], [place].   If you have been instructed to have an in-person evaluation today at a local Urgent Care facility, please use the link below. It will take you to a list of all of our available [facility], including address, phone number and hours of operation. Please do not delay care.  [facility]  If you or a family member do not have a primary care provider, use the link below to schedule a visit and establish care. When you choose a [facility] primary care physician or advanced practice provider, you gain a long-term partner in health. Find a Primary Care Provider  Learn more about  [facility]'s in-office and virtual care options: [facility]

## 2022-05-13 NOTE — Progress Notes (Signed)
Virtual Visit Consent   Sherri Fleming, you are scheduled for a virtual visit with a  provider today. Just as with appointments in the office, your consent must be obtained to participate. Your consent will be active for this visit and any virtual visit you may have with one of our providers in the next 365 days. If you have a MyChart account, a copy of this consent can be sent to you electronically.  As this is a virtual visit, video technology does not allow for your provider to perform a traditional examination. This may limit your provider's ability to fully assess your condition. If your provider identifies any concerns that need to be evaluated in person or the need to arrange testing (such as labs, EKG, etc.), we will make arrangements to do so. Although advances in technology are sophisticated, we cannot ensure that it will always work on either your end or our end. If the connection with a video visit is poor, the visit may have to be switched to a telephone visit. With either a video or telephone visit, we are not always able to ensure that we have a secure connection.  By engaging in this virtual visit, you consent to the provision of healthcare and authorize for your insurance to be billed (if applicable) for the services provided during this visit. Depending on your insurance coverage, you may receive a charge related to this service.  I need to obtain your verbal consent now. Are you willing to proceed with your visit today? Sherri Fleming has provided verbal consent on 05/13/2022 for a virtual visit (video or telephone). Piedad Climes, [place]  Date: 05/13/2022 7:32 PM  Virtual Visit via Video Note   I, Piedad Climes, connected with  Sherri Fleming  ([id number], 1975-11-03) on 05/13/22 at  7:30 PM EDT by a video-enabled telemedicine application and verified that I am speaking with the correct person using two identifiers.  Location: Patient: Virtual Visit  Location Patient: Home Provider: Virtual Visit Location Provider: Home Office   I discussed the limitations of evaluation and management by telemedicine and the availability of in person appointments. The patient expressed understanding and agreed to proceed.    History of Present Illness: Sherri Fleming is a 47 y.o. who identifies as a female who was assigned female at birth, and is being seen today for possible sinusitis. Endorses symptoms starting prior weekend and progressed since onset. Noting nasal and head congestion with sinus pressure and now some facial pressure and discomfort. Now with bilateral ear pain.  Denies recent travel or sick contact. No change to baseline breathing (history of asthma). Tested for COVID at home to be cautious which was negative. OTC -- Mucinex Sinus. Also continuing her daily Singulair.  HPI: HPI  Problems:  Patient Active Problem List   Diagnosis Date Noted   Prediabetes 04/23/2022   Hyperlipidemia 04/23/2022   Tinea pedis 04/23/2022   Rash 04/23/2022   Thrombocytosis 10/11/2020   Venous stasis 10/25/2017   Hearing loss 08/27/2014   Asthma 07/07/2013   Morbid obesity 07/07/2013    Allergies: No Known Allergies Medications:  Current Outpatient Medications:    doxycycline (VIBRA-TABS) 100 MG tablet, Take 1 tablet (100 mg total) by mouth 2 (two) times daily., Disp: 20 tablet, Rfl: 0   albuterol (VENTOLIN HFA) 108 (90 Base) MCG/ACT inhaler, Inhale 1-2 puffs into the lungs every 6 (six) hours as needed for wheezing or shortness of breath., Disp: 18 g, Rfl: 0  clotrimazole (CLOTRIMAZOLE AF) 1 % cream, Apply 1 Application topically 2 (two) times daily., Disp: 60 g, Rfl: 0   desonide (DESOWEN) 0.05 % cream, Apply topically daily as needed (Face.)., Disp: 30 g, Rfl: 0   Fluocinolone Acetonide Scalp 0.01 % OIL, APPLY  OIL TOPICALLY TO SCALP DAILY  AS NEEDED FOR UP TO 14 DAYS AT A TIME, Disp: 119 mL, Rfl: 0   ibuprofen (ADVIL) 800 MG tablet, Take 1 tablet  (800 mg total) by mouth every 8 (eight) hours as needed., Disp: 30 tablet, Rfl: 0   montelukast (SINGULAIR) 10 MG tablet, Take 1 tablet (10 mg total) by mouth at bedtime., Disp: 90 tablet, Rfl: 3   Multiple Vitamins-Minerals (CENTRUM SILVER 50+WOMEN PO), Take by mouth., Disp: , Rfl:    triamcinolone (NASACORT) 55 MCG/ACT AERO nasal inhaler, Place 2 sprays into the nose daily., Disp: 1 each, Rfl: 1   triamcinolone cream (KENALOG) 0.1 %, APPLY TOPICALLY TWICE DAILY AS NEEDED UP TO 14 DAYS AT A TIME, Disp: 30 g, Rfl: 0   Turmeric (QC TUMERIC COMPLEX PO), Take by mouth., Disp: , Rfl:   Observations/Objective: Patient is well-developed, well-nourished in no acute distress.  Resting comfortably at home.  Head is normocephalic, atraumatic.  No labored breathing. Speech is clear and coherent with logical content.  Patient is alert and oriented at baseline.   Assessment and Plan: 1. Acute bacterial sinusitis - doxycycline (VIBRA-TABS) 100 MG tablet; Take 1 tablet (100 mg total) by mouth 2 (two) times daily.  Dispense: 20 tablet; Refill: 0 - triamcinolone (NASACORT) 55 MCG/ACT AERO nasal inhaler; Place 2 sprays into the nose daily.  Dispense: 1 each; Refill: 1  Rx Doxycycline.  Increase fluids.  Rest.  Saline nasal spray.  Probiotic.  Mucinex as directed.  Humidifier in bedroom. Nasacort per orders. Continue regular allergy medications.   Call or return to clinic if symptoms are not improving.   Follow Up Instructions: I discussed the assessment and treatment plan with the patient. The patient was provided an opportunity to ask questions and all were answered. The patient agreed with the plan and demonstrated an understanding of the instructions.  A copy of instructions were sent to the patient via MyChart unless otherwise noted below.   The patient was advised to call back or seek an in-person evaluation if the symptoms worsen or if the condition fails to improve as anticipated.  Time:  I spent  10 minutes with the patient via telehealth technology discussing the above problems/concerns.    Piedad Climes, PA-C

## 2022-09-10 NOTE — Progress Notes (Signed)
This encounter was created in error - please disregard.

## 2023-03-02 ENCOUNTER — Telehealth: Payer: Self-pay

## 2023-03-02 NOTE — Telephone Encounter (Signed)
Reason for CRM: Patient would like her order put in for her annual mammogram and annual cologuard. Please give her a call or send her a message on mychart when she can schedule her mammogram.

## 2023-03-20 ENCOUNTER — Ambulatory Visit: Payer: BC Managed Care – PPO | Admitting: Nurse Practitioner

## 2023-03-20 ENCOUNTER — Encounter: Payer: Self-pay | Admitting: Nurse Practitioner

## 2023-03-20 VITALS — BP 139/84 | HR 77 | Temp 97.6°F | Ht 64.0 in | Wt 245.0 lb

## 2023-03-20 DIAGNOSIS — Z01411 Encounter for gynecological examination (general) (routine) with abnormal findings: Secondary | ICD-10-CM

## 2023-03-20 DIAGNOSIS — Z01419 Encounter for gynecological examination (general) (routine) without abnormal findings: Secondary | ICD-10-CM

## 2023-03-20 DIAGNOSIS — Z1211 Encounter for screening for malignant neoplasm of colon: Secondary | ICD-10-CM

## 2023-03-20 DIAGNOSIS — Z1151 Encounter for screening for human papillomavirus (HPV): Secondary | ICD-10-CM | POA: Diagnosis not present

## 2023-03-20 DIAGNOSIS — R7303 Prediabetes: Secondary | ICD-10-CM

## 2023-03-20 DIAGNOSIS — Z124 Encounter for screening for malignant neoplasm of cervix: Secondary | ICD-10-CM

## 2023-03-20 DIAGNOSIS — Z1231 Encounter for screening mammogram for malignant neoplasm of breast: Secondary | ICD-10-CM

## 2023-03-20 DIAGNOSIS — R5383 Other fatigue: Secondary | ICD-10-CM

## 2023-03-20 DIAGNOSIS — N926 Irregular menstruation, unspecified: Secondary | ICD-10-CM

## 2023-03-20 DIAGNOSIS — D75839 Thrombocytosis, unspecified: Secondary | ICD-10-CM | POA: Diagnosis not present

## 2023-03-20 DIAGNOSIS — E785 Hyperlipidemia, unspecified: Secondary | ICD-10-CM | POA: Diagnosis not present

## 2023-03-20 DIAGNOSIS — L301 Dyshidrosis [pompholyx]: Secondary | ICD-10-CM

## 2023-03-20 DIAGNOSIS — B369 Superficial mycosis, unspecified: Secondary | ICD-10-CM

## 2023-03-20 MED ORDER — FLUOCINOLONE ACETONIDE SCALP 0.01 % EX OIL: TOPICAL_OIL | CUTANEOUS | 0 refills | Status: AC

## 2023-03-20 MED ORDER — KETOCONAZOLE 2 % EX CREA: 1.0000 | TOPICAL_CREAM | Freq: Two times a day (BID) | CUTANEOUS | 0 refills | Status: AC

## 2023-03-20 MED ORDER — DESONIDE 0.05 % EX CREA: TOPICAL_CREAM | Freq: Every day | CUTANEOUS | 0 refills | Status: AC | PRN

## 2023-03-20 MED ORDER — MONTELUKAST SODIUM 10 MG PO TABS: 10.0000 mg | ORAL_TABLET | Freq: Every day | ORAL | 3 refills | Status: DC

## 2023-03-20 MED ORDER — ALBUTEROL SULFATE HFA 108 (90 BASE) MCG/ACT IN AERS: 1.0000 | INHALATION_SPRAY | Freq: Four times a day (QID) | RESPIRATORY_TRACT | 0 refills | Status: DC | PRN

## 2023-03-20 MED ORDER — TRIAMCINOLONE ACETONIDE 0.1 % EX CREA: TOPICAL_CREAM | CUTANEOUS | 0 refills | Status: AC

## 2023-03-20 NOTE — Progress Notes (Signed)
Subjective:    Patient ID: Sherri Fleming, female    DOB: 1975/06/29, 48 y.o.   MRN: [id number]  HPI The patient comes in today for a wellness visit. A review of their health history was completed. A review of medications was also completed.  Any needed refills; Yes  Eating habits: Diet is improving. Patient working on weight loss.   Falls/  MVA accidents in past few months: No  Regular exercise: Walks or goes to the gym three times per week  Menstrual cycles: Irregular. Last cycle was September 2024, lasted 3 days. Moderate flow. Having one about every 3 months.   Sexually active: 1 female partner; defers STI testing  Specialist pt sees on regular basis: No  Preventative health issues were discussed.   Additional concerns: Patient asking about Cologuard.  Regular vision and dental exams.   Review of Systems  Constitutional:  Negative for activity change, appetite change, fatigue and fever.  HENT:  Negative for sore throat and trouble swallowing.   Respiratory:  Negative for cough, chest tightness, shortness of breath and wheezing.   Cardiovascular:  Negative for chest pain and palpitations.  Gastrointestinal:  Negative for abdominal distention, abdominal pain, constipation, diarrhea, nausea and vomiting.  Genitourinary:  Negative for difficulty urinating, dysuria, enuresis, frequency, genital sores, menstrual problem, pelvic pain, urgency, vaginal discharge and vaginal pain.  Neurological:  Negative for headaches.  Psychiatric/Behavioral:  Negative for sleep disturbance. The patient is not nervous/anxious.       Objective:   Physical Exam Vitals and nursing note reviewed. Exam conducted with a chaperone present.  Constitutional:      General: She is not in acute distress.    Appearance: Normal appearance. She is well-developed. She is not ill-appearing.  HENT:     Nose: No congestion.  Neck:     Thyroid: No thyromegaly.     Trachea: No tracheal deviation.      Comments: Thyroid non tender to palpation. No mass or goiter noted.  Cardiovascular:     Rate and Rhythm: Normal rate and regular rhythm.     Pulses: Normal pulses.     Heart sounds: Normal heart sounds, S1 normal and S2 normal. No murmur heard. Pulmonary:     Effort: Pulmonary effort is normal. No respiratory distress.     Breath sounds: Normal breath sounds. No wheezing.  Chest:  Breasts:    Right: No swelling, inverted nipple, mass, skin change or tenderness.     Left: No swelling, inverted nipple, mass, skin change or tenderness.  Abdominal:     General: Bowel sounds are normal. There is no distension.     Palpations: Abdomen is soft.     Tenderness: There is no abdominal tenderness.     Hernia: No hernia is present.     Comments: No obvious masses or abnormalities. Tenderness noted in the right mid quadrant with deep palpation.   Genitourinary:    Comments: Skin warm and dry. Pelvic: external genitalia is normal in appearance no lesions, Vagina: no discharge, urethra has no lesions or masses noted, Cervix: smooth and bulbous, negative for cervical motion tenderness. Bimanual exam: no tenderness or obvious masses; exam limited due to abdominal girth.  Musculoskeletal:     Cervical back: Normal range of motion and neck supple.  Lymphadenopathy:     Cervical: No cervical adenopathy.     Upper Body:     Right upper body: No supraclavicular, axillary or pectoral adenopathy.     Left upper body:  No supraclavicular, axillary or pectoral adenopathy.  Skin:    General: Skin is warm and dry.     Findings: Rash present.     Comments: Shiny, confluent rash under bilateral breasts.   Neurological:     Mental Status: She is alert and oriented to person, place, and time.  Psychiatric:        Mood and Affect: Mood normal.        Behavior: Behavior normal.        Thought Content: Thought content normal.        Judgment: Judgment normal.    Vitals:   03/20/23 1058  BP: 139/84  Pulse: 77   Temp: 97.6 F (36.4 C)  Height: 5\' 4"  (1.626 m)  Weight: 245 lb (111.1 kg)  SpO2: 98%  BMI (Calculated): 42.03       03/20/2023   11:04 AM 04/23/2022   11:34 AM 10/05/2020    9:20 AM 08/29/2020   10:33 AM 06/05/2020    8:51 AM  Depression screen PHQ 2/9  Decreased Interest 0 0 0 0 0  Down, Depressed, Hopeless 0 0 0 0 0  PHQ - 2 Score 0 0 0 0 0  Altered sleeping 0 0     Tired, decreased energy 0 0     Change in appetite 1 0     Feeling bad or failure about yourself  0 0     Trouble concentrating 0 0     Moving slowly or fidgety/restless 0 0     Suicidal thoughts 0 0     PHQ-9 Score 1 0     Difficult doing work/chores Not difficult at all Not difficult at all          03/20/2023   11:04 AM 04/23/2022   11:40 AM  GAD 7 : Generalized Anxiety Score  Nervous, Anxious, on Edge 0 0  Control/stop worrying 0 0  Worry too much - different things 0 0  Trouble relaxing 0 0  Restless 0 0  Easily annoyed or irritable 0 0  Afraid - awful might happen 0 0  Total GAD 7 Score 0 0  Anxiety Difficulty Not difficult at all Not difficult at all      Assessment & Plan:  1. Well woman exam with routine gynecological exam (Primary) -Encouraged patient to continue exercising and following a healthy diet.   2. Thrombocytosis - CBC with Differential/Platelet  3. Hyperlipidemia, unspecified hyperlipidemia type - Lipid panel  4. Prediabetes - Hemoglobin A1c - Comprehensive metabolic panel  5. Fatigue, unspecified type - Comprehensive metabolic panel - T4, free - TSH  6. Screening for cervical cancer - IGP, Aptima HPV  7. Screening for HPV (human papillomavirus) - IGP, Aptima HPV  8. Screen for colon cancer - Discussed options. Based on history she is average risk for colon cancer. Cologuard ordered.   9. Fungal infection of skin -Ordered Ketoconazole. Call back if worsens or persists.   10. Irregular menses -Contact office if more than 6 months with no vaginal bleeding. Plan  labs at that time.   11. Dyshidrotic eczema - Continue current topical steroids as directed.   Meds ordered this encounter  Medications   albuterol (VENTOLIN HFA) 108 (90 Base) MCG/ACT inhaler    Sig: Inhale 1-2 puffs into the lungs every 6 (six) hours as needed for wheezing or shortness of breath.    Dispense:  18 g    Refill:  0   desonide (DESOWEN) 0.05 % cream  Sig: Apply topically daily as needed (Face.).    Dispense:  30 g    Refill:  0   Fluocinolone Acetonide Scalp 0.01 % OIL    Sig: APPLY  OIL TOPICALLY TO SCALP DAILY  AS NEEDED FOR UP TO 14 DAYS AT A TIME    Dispense:  119 mL    Refill:  0   montelukast (SINGULAIR) 10 MG tablet    Sig: Take 1 tablet (10 mg total) by mouth at bedtime.    Dispense:  90 tablet    Refill:  3   triamcinolone cream (KENALOG) 0.1 %    Sig: APPLY TOPICALLY TWICE DAILY AS NEEDED UP TO 14 DAYS AT A TIME    Dispense:  30 g    Refill:  0   ketoconazole (NIZORAL) 2 % cream    Sig: Apply 1 Application topically 2 (two) times daily. PRN    Dispense:  30 g    Refill:  0    Supervising Provider:   Babs Sciara [[id number]]     Return in about 1 year (around 03/19/2024) for physical.

## 2023-03-22 ENCOUNTER — Encounter: Payer: Self-pay | Admitting: Nurse Practitioner

## 2023-03-22 DIAGNOSIS — N926 Irregular menstruation, unspecified: Secondary | ICD-10-CM | POA: Insufficient documentation

## 2023-03-22 DIAGNOSIS — L301 Dyshidrosis [pompholyx]: Secondary | ICD-10-CM | POA: Insufficient documentation

## 2023-03-25 DIAGNOSIS — Z1211 Encounter for screening for malignant neoplasm of colon: Secondary | ICD-10-CM | POA: Diagnosis not present

## 2023-03-27 LAB — IGP, APTIMA HPV: HPV Aptima: NEGATIVE

## 2023-03-27 LAB — SPECIMEN STATUS REPORT

## 2023-03-28 ENCOUNTER — Encounter: Payer: Self-pay | Admitting: Nurse Practitioner

## 2023-03-31 LAB — COLOGUARD: COLOGUARD: NEGATIVE

## 2023-04-02 ENCOUNTER — Encounter (HOSPITAL_COMMUNITY): Payer: Self-pay

## 2023-04-02 ENCOUNTER — Ambulatory Visit (HOSPITAL_COMMUNITY)
Admission: RE | Admit: 2023-04-02 | Discharge: 2023-04-02 | Disposition: A | Discharge status: Home / Self Care | Payer: BC Managed Care – PPO | Source: Ambulatory Visit | Attending: Nurse Practitioner | Admitting: Nurse Practitioner

## 2023-04-02 DIAGNOSIS — Z1231 Encounter for screening mammogram for malignant neoplasm of breast: Secondary | ICD-10-CM | POA: Diagnosis not present

## 2023-05-08 ENCOUNTER — Ambulatory Visit: Payer: Self-pay

## 2023-05-08 ENCOUNTER — Encounter: Payer: Self-pay | Admitting: Family Medicine

## 2023-05-08 ENCOUNTER — Ambulatory Visit: Admitting: Family Medicine

## 2023-05-08 VITALS — BP 130/84 | HR 79 | Temp 97.3°F | Ht 64.0 in | Wt 246.0 lb

## 2023-05-08 DIAGNOSIS — J301 Allergic rhinitis due to pollen: Secondary | ICD-10-CM

## 2023-05-08 DIAGNOSIS — R7303 Prediabetes: Secondary | ICD-10-CM | POA: Diagnosis not present

## 2023-05-08 DIAGNOSIS — E785 Hyperlipidemia, unspecified: Secondary | ICD-10-CM | POA: Diagnosis not present

## 2023-05-08 DIAGNOSIS — J029 Acute pharyngitis, unspecified: Secondary | ICD-10-CM | POA: Diagnosis not present

## 2023-05-08 DIAGNOSIS — R5383 Other fatigue: Secondary | ICD-10-CM | POA: Diagnosis not present

## 2023-05-08 DIAGNOSIS — D75839 Thrombocytosis, unspecified: Secondary | ICD-10-CM | POA: Diagnosis not present

## 2023-05-08 LAB — POCT RAPID STREP A (OFFICE): Rapid Strep A Screen: NEGATIVE

## 2023-05-08 NOTE — Progress Notes (Signed)
   Subjective:    Patient ID: Sherri Fleming, female    DOB: [birth year], 48 y.o.   MRN: [id number]  HPI  Sore throat 2 days  Congested head, nasal congestion and watery nose and eyes No fever or body aches. Symptoms for 4 days  Patient with sore throat over past couple days congestion watery eyes denies nausea vomiting diarrhea. Review of Systems     Objective:   Physical Exam  Throat minimal erythema eardrums normal watery eyes neck no adenopathy lungs clear heart regular  Does not appear toxic no respiratory distress    Assessment & Plan:   Viral syndrome is possible.  Patient will do home COVID test and let us know the result Over-the-counter allergy eyedrops along with Zyrtec and Nasacort More than likely allergic rhinitis If patient develops any worrisome symptoms such as shortness of breath fever chills or worse to follow-up here or ER urgent care

## 2023-05-08 NOTE — Telephone Encounter (Signed)
 Noted. appt scheduled

## 2023-05-08 NOTE — Telephone Encounter (Signed)
  Chief Complaint: sinus pain/congestion Symptoms: nasal congestion/sinus pain, sore throat, watery eyes, mild/puffy swelling to eyes and cheeks Frequency: x 3-4 days Pertinent Negatives: Patient denies fever, cough, eyes swollen shut Disposition: [] ED /[] Urgent Care (no appt availability in office) / [x] Appointment(In office/virtual)/ []  [facility]/ [] Home Care/ [] Refused Recommended Disposition /[] [facility]/ []  Follow-up with PCP Additional Notes: Patient states she thought it was allergies so she started taking Allegra and Nasonex yesterday. No labored breathing or audible wheezing noted, patient speaking in full sentences. Patient agreeable to acute visit with available provider today, no appts available with PCP.   Summary: congestion in nose, head pressure, watering eyes, sore throat   Copied From CRM [id number]. Reason for Triage: patient calling in, congestion in nose, head pressure, watering eyes, sore throat, swallowing 3 pain scale Patient phone # [phone]  ok to leave detailed message         Reason for Disposition  [1] Redness or swelling on the cheek, forehead or around the eye AND [2] no fever  Answer Assessment - Initial Assessment Questions 1. LOCATION: "Where does it hurt?"      Forehead, eyes, cheeks.  2. ONSET: "When did the sinus pain start?"  (e.g., hours, days)      X couple of days.  3. SEVERITY: "How bad is the pain?"   (Scale 1-10; mild, moderate or severe)   - MILD (1-3): doesn't interfere with normal activities    - MODERATE (4-7): interferes with normal activities (e.g., work or school) or awakens from sleep   - SEVERE (8-10): excruciating pain and patient unable to do any normal activities        4-5/10.  4. RECURRENT SYMPTOM: "Have you ever had sinus problems before?" If Yes, ask: "When was the last time?" and "What happened that time?"      She states she has allergies and has had sinus infections from allergies in the past.  She states it has been about 6-7 years since her last one.  5. NASAL CONGESTION: "Is the nose blocked?" If Yes, ask: "Can you open it or must you breathe through your mouth?"     Yes, she states she has been having to breathe out of her mouth in the morning.  6. NASAL DISCHARGE: "Do you have discharge from your nose?" If so ask, "What color?"     She states in the evening her nose is dripping, clear mucus.  7. FEVER: "Do you have a fever?" If Yes, ask: "What is it, how was it measured, and when did it start?"      Denies.  8. OTHER SYMPTOMS: "Do you have any other symptoms?" (e.g., sore throat, cough, earache, difficulty breathing)     Post nasal drip, sore throat 5/10, mild facial swelling to eyes and cheeks   9. PREGNANCY: "Is there any chance you are pregnant?" "When was your last menstrual period?"     N/A.  Protocols used: Sinus Pain or Congestion-A-AH

## 2023-05-09 LAB — LIPID PANEL
Chol/HDL Ratio: 3.7 ratio (ref 0.0–4.4)
Cholesterol, Total: 175 mg/dL (ref 100–199)
HDL: 47 mg/dL (ref >39)
LDL Chol Calc (NIH): 115 mg/dL — ABNORMAL HIGH (ref 0–99)
Triglycerides: 70 mg/dL (ref 0–149)
VLDL Cholesterol Cal: 13 mg/dL (ref 5–40)

## 2023-05-09 LAB — COMPREHENSIVE METABOLIC PANEL WITH GFR
ALT: 15 IU/L (ref 0–32)
AST: 17 IU/L (ref 0–40)
Albumin: 3.9 g/dL (ref 3.9–4.9)
Alkaline Phosphatase: 75 IU/L (ref 44–121)
BUN/Creatinine Ratio: 15 (ref 9–23)
BUN: 12 mg/dL (ref 6–24)
Bilirubin Total: 0.3 mg/dL (ref 0.0–1.2)
CO2: 25 mmol/L (ref 20–29)
Calcium: 9.6 mg/dL (ref 8.7–10.2)
Chloride: 102 mmol/L (ref 96–106)
Creatinine, Ser: 0.78 mg/dL (ref 0.57–1.00)
Globulin, Total: 3.4 g/dL (ref 1.5–4.5)
Glucose: 88 mg/dL (ref 70–99)
Potassium: 4.3 mmol/L (ref 3.5–5.2)
Sodium: 141 mmol/L (ref 134–144)
Total Protein: 7.3 g/dL (ref 6.0–8.5)
eGFR: 93 mL/min/{1.73_m2} (ref >59)

## 2023-05-09 LAB — CBC WITH DIFFERENTIAL/PLATELET
Basophils Absolute: 0 10*3/uL (ref 0.0–0.2)
Basos: 1 %
EOS (ABSOLUTE): 0.1 10*3/uL (ref 0.0–0.4)
Eos: 2 %
Hematocrit: 37.5 % (ref 34.0–46.6)
Hemoglobin: 12.1 g/dL (ref 11.1–15.9)
Immature Grans (Abs): 0 10*3/uL (ref 0.0–0.1)
Immature Granulocytes: 0 %
Lymphocytes Absolute: 1.7 10*3/uL (ref 0.7–3.1)
Lymphs: 36 %
MCH: 29.6 pg (ref 26.6–33.0)
MCHC: 32.3 g/dL (ref 31.5–35.7)
MCV: 92 fL (ref 79–97)
Monocytes Absolute: 0.3 10*3/uL (ref 0.1–0.9)
Monocytes: 5 %
Neutrophils Absolute: 2.6 10*3/uL (ref 1.4–7.0)
Neutrophils: 56 %
Platelets: 458 10*3/uL — ABNORMAL HIGH (ref 150–450)
RBC: 4.09 x10E6/uL (ref 3.77–5.28)
RDW: 14.9 % (ref 11.7–15.4)
WBC: 4.7 10*3/uL (ref 3.4–10.8)

## 2023-05-09 LAB — HEMOGLOBIN A1C
Est. average glucose Bld gHb Est-mCnc: 134 mg/dL
Hgb A1c MFr Bld: 6.3 % — ABNORMAL HIGH (ref 4.8–5.6)

## 2023-05-09 LAB — TSH: TSH: 0.695 u[IU]/mL (ref 0.450–4.500)

## 2023-05-09 LAB — T4, FREE: Free T4: 1.12 ng/dL (ref 0.82–1.77)

## 2023-11-30 ENCOUNTER — Encounter: Payer: Self-pay | Admitting: Family Medicine

## 2023-11-30 ENCOUNTER — Ambulatory Visit: Payer: Self-pay

## 2023-11-30 ENCOUNTER — Ambulatory Visit (INDEPENDENT_AMBULATORY_CARE_PROVIDER_SITE_OTHER): Admitting: Family Medicine

## 2023-11-30 VITALS — BP 140/84 | HR 88 | Ht 65.0 in | Wt 241.0 lb

## 2023-11-30 DIAGNOSIS — R221 Localized swelling, mass and lump, neck: Secondary | ICD-10-CM | POA: Diagnosis not present

## 2023-11-30 NOTE — Telephone Encounter (Signed)
 FYI Only or Action Required?: FYI only for provider.  Patient was last seen in primary care on 05/08/2023 by Alphonsa Glendia LABOR, MD.  Called Nurse Triage reporting Nasal Congestion and Facial Swelling.  Symptoms began about a month ago.  Interventions attempted: Ice/heat application.  Symptoms are: stable.  Triage Disposition: See PCP When Office is Open (Within 3 Days)  Patient/caregiver understands and will follow disposition?: Yes                            Copied from CRM #[id number]. Topic: Clinical - Red Word Triage >> Nov 30, 2023  8:28 AM Emylou G wrote: [facility] that prompted transfer to Nurse Triage: neck swelling on the left side.. it was on both.. still w/nasal drainage and it makes her spit Reason for Disposition  [1] MILD face swelling (e.g., puffiness) AND [2] persists > 3 days  Answer Assessment - Initial Assessment Questions 1. ONSET: When did the swelling start? (e.g., minutes, hours, days)     About a month, denies worsening  2. LOCATION: What part of the face is swollen? (e.g., cheek, entire face, jaw joint area, under jaw)     Under left side of chin 3. SEVERITY: How swollen is it?     Mild 4. ITCHING: Is there any itching? If Yes, ask: How much?   (Scale 1-10; mild, moderate or severe)     Denies 5. PAIN: Is the swelling painful to touch? If Yes, ask: How painful is it?   (Scale 0-10; mild, moderate or severe)     Denies, sometimes I have a piercing pain along left side of ear- states this happens every once in awhile 6. FEVER: Do you have a fever? If Yes, ask: What is it, how was it measured, and when did it start?      Denies  7. CAUSE: What do you think is causing the face swelling?     States swelling started after a recent cold 8. NEW MEDICINES: Have there been any new medicines started recently?     Denies 9. RECURRENT SYMPTOM: Have you had face swelling before? If Yes, ask: When was the last time? What  happened that time?     I have a facial swelling every now and then, but I don't recall under my neck before 10. OTHER SYMPTOMS: Do you have any other symptoms? (e.g., leg swelling, toothache)     Stuffy nose and nasal drainage, denies difficulty breathing, denies difficulty swallowing, denies earache, denies sore throat, denies cough, denies redness  11. PREGNANCY: Is there any chance you are pregnant? When was your last menstrual period?     N/A  Protocols used: Face Swelling-A-AH

## 2023-11-30 NOTE — Assessment & Plan Note (Signed)
 Etiology and prognosis unclear at this time.  Obtaining ultrasound.

## 2023-11-30 NOTE — Progress Notes (Signed)
 Subjective:  Patient ID: Sherri Fleming, female    DOB: [birth year]  Age: 48 y.o. MRN: [id number]  CC:   Chief Complaint  Patient presents with   Facial Swelling    Swelling under the chin    HPI:  48 year old female presents for evaluation of the above.  The patient reports that she had a respiratory infection about a month ago.  She states that she improved.  However, she states that she has had persistent swelling under her chin particularly on the left side.  It is not painful.  She has simply noticed this and it has not improved.  She is concerned about this.  She has no other symptoms at this time.    Patient Active Problem List   Diagnosis Date Noted   Localized swelling, mass or lump of neck 11/30/2023   Irregular menses 03/22/2023   Dyshidrotic eczema 03/22/2023   Prediabetes 04/23/2022   Hyperlipidemia 04/23/2022   Thrombocytosis 10/11/2020   Venous stasis 10/25/2017   Hearing loss 08/27/2014   Asthma 07/07/2013   Morbid obesity (HCC) 07/07/2013    Social Hx   Social History   Socioeconomic History   Marital status: Divorced    Spouse name: Not on file   Number of children: Not on file   Years of education: Not on file   Highest education level: Not on file  Occupational History   Not on file  Tobacco Use   Smoking status: Never   Smokeless tobacco: Never  Vaping Use   Vaping status: Never Used  Substance and Sexual Activity   Alcohol use: No   Drug use: No   Sexual activity: Yes    Partners: Male  Other Topics Concern   Not on file  Social History Narrative   Not on file   Social Drivers of Health   Financial Resource Strain: Low Risk  (10/29/2020)   Overall Financial Resource Strain (CARDIA)    Difficulty of Paying Living Expenses: Not hard at all  Food Insecurity: No Food Insecurity (10/29/2020)   Hunger Vital Sign    Worried About Running Out of Food in the Last Year: Never true    Ran Out of Food in the Last Year: Never true   Transportation Needs: No Transportation Needs (10/29/2020)   PRAPARE - Administrator, Civil Service (Medical): No    Lack of Transportation (Non-Medical): No  Physical Activity: Insufficiently Active (10/29/2020)   Exercise Vital Sign    Days of Exercise per Week: 3 days    Minutes of Exercise per Session: 30 min  Stress: No Stress Concern Present (10/29/2020)   [employer] of Occupational Health - Occupational Stress Questionnaire    Feeling of Stress : Not at all  Social Connections: Moderately Integrated (10/29/2020)   Social Connection and Isolation Panel    Frequency of Communication with Friends and Family: More than three times a week    Frequency of Social Gatherings with Friends and Family: More than three times a week    Attends Religious Services: More than 4 times per year    Active Member of [employer] or Organizations: Yes    Attends Banker Meetings: 1 to 4 times per year    Marital Status: Divorced    Review of Systems Per HPI  Objective:  BP (!) 140/84   Pulse 88   Ht 5' 5 (1.651 m)   Wt 241 lb (109.3 kg)   SpO2 99%   BMI 40.10  kg/m      11/30/2023   10:41 AM 11/30/2023   10:16 AM 05/08/2023   10:48 AM  BP/Weight  Systolic BP 140 150 130  Diastolic BP 84 96 84  Wt. (Lbs)  241 246  BMI  40.1 kg/m2 42.23 kg/m2    Physical Exam Constitutional:      Appearance: Normal appearance. She is obese.  HENT:     Head: Normocephalic and atraumatic.  Neck:      Comments: There is mild swelling at the level location.  No fluctuance.  No tenderness to palpation.  There is no palpable firm area that is consistent with lymphadenopathy. Cardiovascular:     Rate and Rhythm: Normal rate and regular rhythm.  Pulmonary:     Effort: Pulmonary effort is normal.     Breath sounds: Normal breath sounds.  Neurological:     Mental Status: She is alert.     Lab Results  Component Value Date   WBC 4.7 05/08/2023   HGB 12.1 05/08/2023   HCT  37.5 05/08/2023   PLT 458 (H) 05/08/2023   GLUCOSE 88 05/08/2023   CHOL 175 05/08/2023   TRIG 70 05/08/2023   HDL 47 05/08/2023   LDLCALC 115 (H) 05/08/2023   ALT 15 05/08/2023   AST 17 05/08/2023   NA 141 05/08/2023   K 4.3 05/08/2023   CL 102 05/08/2023   CREATININE 0.78 05/08/2023   BUN 12 05/08/2023   CO2 25 05/08/2023   TSH 0.695 05/08/2023   HGBA1C 6.3 (H) 05/08/2023     Assessment & Plan:  Localized swelling, mass or lump of neck Assessment & Plan: Etiology and prognosis unclear at this time.  Obtaining ultrasound.  Orders: -     US  SOFT TISSUE HEAD & NECK (NON-THYROID )    Follow-up:  Pending US    Jacqulyn Ahle DO [facility]

## 2023-11-30 NOTE — Patient Instructions (Signed)
 Arranging US .   We will reach out with results once it is done.  Take care  Dr. Bluford

## 2023-12-03 ENCOUNTER — Ambulatory Visit (HOSPITAL_COMMUNITY)
Admission: RE | Admit: 2023-12-03 | Discharge: 2023-12-03 | Disposition: A | Discharge status: Home / Self Care | Source: Ambulatory Visit | Attending: Family Medicine | Admitting: Family Medicine

## 2023-12-03 ENCOUNTER — Ambulatory Visit: Payer: Self-pay | Admitting: Family Medicine

## 2023-12-03 DIAGNOSIS — R221 Localized swelling, mass and lump, neck: Secondary | ICD-10-CM | POA: Diagnosis not present

## 2024-02-08 ENCOUNTER — Encounter: Payer: Self-pay | Admitting: Physician Assistant

## 2024-02-08 VITALS — BP 149/97 | HR 115 | Temp 97.9°F | Ht 65.0 in | Wt 245.2 lb

## 2024-02-08 DIAGNOSIS — J069 Acute upper respiratory infection, unspecified: Secondary | ICD-10-CM | POA: Diagnosis not present

## 2024-02-08 MED ORDER — ALBUTEROL SULFATE HFA 108 (90 BASE) MCG/ACT IN AERS: 1.0000 | INHALATION_SPRAY | Freq: Four times a day (QID) | RESPIRATORY_TRACT | 0 refills | Status: AC | PRN

## 2024-02-08 MED ORDER — PREDNISONE 20 MG PO TABS: 40.0000 mg | ORAL_TABLET | Freq: Every day | ORAL | 0 refills | Status: AC

## 2024-02-08 MED ORDER — ALBUTEROL SULFATE HFA 108 (90 BASE) MCG/ACT IN AERS: 1.0000 | INHALATION_SPRAY | Freq: Four times a day (QID) | RESPIRATORY_TRACT | 0 refills | Status: DC | PRN

## 2024-02-08 NOTE — Progress Notes (Signed)
 "  Acute Office Visit  Subjective:     Patient ID: Sherri Fleming, female    DOB: October 25, 1975, 49 y.o.   MRN: [id number]   Discussed the use of AI scribe software for clinical note transcription with the patient, who gave verbal consent to proceed.  History of Present Illness Sherri Fleming is a 49 year old female who presents with upper respiratory symptoms including cough, headache, and wheezing. Her symptoms started Wednesday with a scratchy throat that progressed to loss of voice, followed by a mild fever on Friday, then cough, severe headache, earache, and wheezing. Fever occurred only on Friday. Mucinex and Zyrtec  have provided little relief. She is eating and drinking normally and no one else in the household is sick. Her throat has improved, but she still has severe head pain triggered by coughing. She was previously told her lymph nodes were reactive and to return in 4 to 5 weeks; they remain reactive due acute URI. She has not had a flu shot this year.    Review of Systems  Constitutional:  Positive for fatigue and fever. Negative for activity change and appetite change.  HENT:  Positive for congestion, ear pain, rhinorrhea, sinus pressure, sore throat and voice change.   Respiratory:  Positive for cough. Negative for shortness of breath and wheezing.   Cardiovascular:  Negative for chest pain.  Neurological:  Positive for headaches. Negative for dizziness and light-headedness.        Objective:     BP (!) 149/97   Pulse (!) 115   Temp 97.9 F (36.6 C)   Ht 5' 5 (1.651 m)   Wt 245 lb 3.2 oz (111.2 kg)   SpO2 96%   BMI 40.80 kg/m   Physical Exam Vitals reviewed.  Constitutional:      General: She is not in acute distress.    Appearance: Normal appearance. She is not ill-appearing.  HENT:     Right Ear: Tympanic membrane normal.     Left Ear: Tympanic membrane normal.     Nose: Nose normal.     Mouth/Throat:     Mouth: Mucous membranes are  moist.     Pharynx: Oropharynx is clear. Posterior oropharyngeal erythema present.  Eyes:     Extraocular Movements: Extraocular movements intact.     Conjunctiva/sclera: Conjunctivae normal.  Cardiovascular:     Rate and Rhythm: Normal rate and regular rhythm.     Heart sounds: Normal heart sounds. No murmur heard.    No friction rub.  Pulmonary:     Effort: Pulmonary effort is normal.     Breath sounds: Normal breath sounds. No wheezing, rhonchi or rales.  Skin:    General: Skin is warm and dry.  Neurological:     General: No focal deficit present.     Mental Status: She is alert and oriented to person, place, and time.  Psychiatric:        Mood and Affect: Mood normal.        Behavior: Behavior normal.     No results found for any visits on 02/08/24.      Assessment & Plan:  URI with cough and congestion Assessment & Plan: Patient appears stable today. Benign exam, lungs with mild wheezing bilaterally, no indication for chest XR at this time. Likely self-resolving viral infection. Supportive care reviewed with patient. Discussed with patient that there are no indications for antibiotics at this time, and viral respiratory illness can be persistent in duration. Prednisone   x 5 days and albuterol  inhaler for cough. Tylenol or ibuprofen  for pain as needed. May continue with OTC cold medications. Patient instructed to return to clinic if worsening shortness of breath, chest pain, hypoxia, or other concerns. Patient agreeable to plan.   Orders: -     predniSONE ; Take 2 tablets (40 mg total) by mouth daily for 5 days.  Dispense: 10 tablet; Refill: 0  Other orders -     Albuterol  Sulfate HFA; Inhale 1-2 puffs into the lungs every 6 (six) hours as needed for wheezing or shortness of breath.  Dispense: 18 g; Refill: 0    No follow-ups on file.  Karry Causer, PA-C  "

## 2024-02-08 NOTE — Assessment & Plan Note (Signed)
 Patient appears stable today. Benign exam, lungs with mild wheezing bilaterally, no indication for chest XR at this time. Likely self-resolving viral infection. Supportive care reviewed with patient. Discussed with patient that there are no indications for antibiotics at this time, and viral respiratory illness can be persistent in duration. Prednisone  x 5 days and albuterol  inhaler for cough. Tylenol or ibuprofen  for pain as needed. May continue with OTC cold medications. Patient instructed to return to clinic if worsening shortness of breath, chest pain, hypoxia, or other concerns. Patient agreeable to plan.

## 2024-02-09 ENCOUNTER — Other Ambulatory Visit: Payer: Self-pay | Admitting: Physician Assistant

## 2024-02-09 MED ORDER — DOXYCYCLINE HYCLATE 100 MG PO TABS: 100.0000 mg | ORAL_TABLET | Freq: Two times a day (BID) | ORAL | 0 refills | Status: AC
